# Patient Record
Sex: Female | Born: 2000 | Race: White | Hispanic: No | Marital: Single | Smoking: Never smoker
Health system: Southern US, Community
[De-identification: ages and names within clinical notes are randomized; demographics above are authoritative.]

## PROBLEM LIST (undated history)

## (undated) DIAGNOSIS — J358 Other chronic diseases of tonsils and adenoids: Secondary | ICD-10-CM

---
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
## 2021-01-18 DIAGNOSIS — L309 Dermatitis, unspecified: Secondary | ICD-10-CM | POA: Diagnosis not present

## 2021-02-06 ENCOUNTER — Other Ambulatory Visit: Payer: Self-pay

## 2021-02-06 ENCOUNTER — Ambulatory Visit (INDEPENDENT_AMBULATORY_CARE_PROVIDER_SITE_OTHER): Payer: Medicaid Other | Admitting: Nurse Practitioner

## 2021-02-06 ENCOUNTER — Encounter: Payer: Self-pay | Admitting: Nurse Practitioner

## 2021-02-06 VITALS — BP 130/90 | HR 107 | Temp 98.3°F | Resp 20 | Ht 63.0 in | Wt 113.0 lb

## 2021-02-06 DIAGNOSIS — F900 Attention-deficit hyperactivity disorder, predominantly inattentive type: Secondary | ICD-10-CM

## 2021-02-06 MED ORDER — LISDEXAMFETAMINE DIMESYLATE 40 MG PO CAPS: 40.0000 mg | ORAL_CAPSULE | ORAL | 0 refills | Status: DC

## 2021-02-06 NOTE — Progress Notes (Signed)
Subjective:    Patient ID: Jasmine Velazquez, female    DOB: 2001-11-03, 20 y.o.   MRN: [id number]   Chief Complaint: ADHD   HPI Patient brought in today by hersefl for follow up of ADHD. Currently taking vyvanse 30mg  daily. Behavior- no issues Grades- good Medication side effects- none Weight loss- none Sleeping habits- none Any concerns- says that meds wear off in about 4hours. After 4 hours she says she can no longer concentrate.   [place] CSRS reviewed: Yes Any suspicious activity on [place] Csrs: No  Contract signed: 02/06/21    Review of Systems  Constitutional: Negative for diaphoresis.  Eyes: Negative for pain.  Respiratory: Negative for shortness of breath.   Cardiovascular: Negative for chest pain, palpitations and leg swelling.  Gastrointestinal: Negative for abdominal pain.  Endocrine: Negative for polydipsia.  Skin: Negative for rash.  Neurological: Negative for dizziness, weakness and headaches.  Hematological: Does not bruise/bleed easily.  All other systems reviewed and are negative.      Objective:   Physical Exam Vitals and nursing note reviewed.  Constitutional:      General: She is not in acute distress.    Appearance: Normal appearance. She is well-developed and well-nourished.  HENT:     Mouth/Throat:     Mouth: Oropharynx is clear and moist.  Eyes:     Extraocular Movements: EOM normal.  Neck:     Vascular: No carotid bruit or JVD.  Cardiovascular:     Rate and Rhythm: Normal rate and regular rhythm.     Pulses: Intact distal pulses.     Heart sounds: Normal heart sounds.  Pulmonary:     Effort: Pulmonary effort is normal. No respiratory distress.     Breath sounds: Normal breath sounds. No wheezing or rales.  Chest:     Chest wall: No tenderness.  Abdominal:     General: There is no distension or abdominal bruit. Aorta is normal.     Palpations: There is no hepatomegaly, splenomegaly, mass or pulsatile mass.     Tenderness: There is no  abdominal tenderness.  Musculoskeletal:        General: No edema. Normal range of motion.     Cervical back: Normal range of motion and neck supple.  Lymphadenopathy:     Cervical: No cervical adenopathy.  Skin:    General: Skin is warm and dry.  Neurological:     Mental Status: She is alert and oriented to person, place, and time.     Deep Tendon Reflexes: Reflexes are normal and symmetric.  Psychiatric:        Mood and Affect: Mood and affect normal.        Behavior: Behavior normal.        Thought Content: Thought content normal.        Judgment: Judgment normal.    BP 130/90   Pulse (!) 107   Temp 98.3 F (36.8 C) (Temporal)   Resp 20   Ht 5\' 3"  (1.6 m)   Wt 113 lb (51.3 kg)   SpO2 98%   BMI 20.02 kg/m         Assessment & Plan:  02/08/21 in today with chief complaint of ADHD   1. Attention deficit hyperactivity disorder (ADHD), predominantly inattentive type Stress management 'increased dose from 30mg  to 40mg  daily - lisdexamfetamine (VYVANSE) 40 MG capsule; Take 1 capsule (40 mg total) by mouth every morning.  Dispense: 30 capsule; Refill: 0 - lisdexamfetamine (VYVANSE) 40 MG capsule;  Take 1 capsule (40 mg total) by mouth every morning.  Dispense: 30 capsule; Refill: 0 - lisdexamfetamine (VYVANSE) 40 MG capsule; Take 1 capsule (40 mg total) by mouth every morning.  Dispense: 30 capsule; Refill: 0    The above assessment and management plan was discussed with the patient. The patient verbalized understanding of and has agreed to the management plan. Patient is aware to call the clinic if symptoms persist or worsen. Patient is aware when to return to the clinic for a follow-up visit. Patient educated on when it is appropriate to go to the emergency department.   Mary-Margaret Daphine Deutscher, FNP

## 2021-03-14 DIAGNOSIS — M94262 Chondromalacia, left knee: Secondary | ICD-10-CM | POA: Diagnosis not present

## 2021-03-15 ENCOUNTER — Encounter: Payer: Self-pay | Admitting: Nurse Practitioner

## 2021-03-15 ENCOUNTER — Ambulatory Visit (INDEPENDENT_AMBULATORY_CARE_PROVIDER_SITE_OTHER): Payer: Medicaid Other | Admitting: Nurse Practitioner

## 2021-03-15 ENCOUNTER — Other Ambulatory Visit: Payer: Self-pay

## 2021-03-15 DIAGNOSIS — F900 Attention-deficit hyperactivity disorder, predominantly inattentive type: Secondary | ICD-10-CM | POA: Diagnosis not present

## 2021-03-15 MED ORDER — LISDEXAMFETAMINE DIMESYLATE 40 MG PO CAPS: 40.0000 mg | ORAL_CAPSULE | ORAL | 0 refills | Status: DC

## 2021-03-15 NOTE — Progress Notes (Signed)
   Subjective:    Patient ID: Jasmine Velazquez, female    DOB: 07-23-2001, 20 y.o.   MRN: [id number]   Chief Complaint: ADHD   HPI Patient comes in today for refill of vyvanse. She has been on ADHD meds for several years. This is her senior year in high school. Her grades are good. She is going t go to the [employer] college. Not sure what career she wants to pursue. She denies nay medication side effects, no problems sleeping and no weight loss.    Review of Systems  Constitutional: Negative for diaphoresis.  Eyes: Negative for pain.  Respiratory: Negative for shortness of breath.   Cardiovascular: Negative for chest pain, palpitations and leg swelling.  Gastrointestinal: Negative for abdominal pain.  Endocrine: Negative for polydipsia.  Skin: Negative for rash.  Neurological: Negative for dizziness, weakness and headaches.  Hematological: Does not bruise/bleed easily.  All other systems reviewed and are negative.      Objective:   Physical Exam Vitals and nursing note reviewed.  Constitutional:      Appearance: Normal appearance.  Cardiovascular:     Rate and Rhythm: Normal rate and regular rhythm.     Heart sounds: Normal heart sounds.  Pulmonary:     Effort: Pulmonary effort is normal.     Breath sounds: Normal breath sounds.  Skin:    General: Skin is warm.  Neurological:     General: No focal deficit present.     Mental Status: She is alert and oriented to person, place, and time.  Psychiatric:        Mood and Affect: Mood normal.        Behavior: Behavior normal.     BP 116/80   Pulse 91   Temp 98.9 F (37.2 C) (Temporal)   Resp 20   Ht 5\' 3"  (1.6 m)   Wt 108 lb (49 kg)   SpO2 100%   BMI 19.13 kg/m        Assessment & Plan:  in today with chief complaint of ADHD   1. Attention deficit hyperactivity disorder (ADHD), predominantly inattentive type Stress management - lisdexamfetamine (VYVANSE) 40 MG capsule; Take 1 capsule  (40 mg total) by mouth every morning.  Dispense: 30 capsule; Refill: 0 - lisdexamfetamine (VYVANSE) 40 MG capsule; Take 1 capsule (40 mg total) by mouth every morning.  Dispense: 30 capsule; Refill: 0 - lisdexamfetamine (VYVANSE) 40 MG capsule; Take 1 capsule (40 mg total) by mouth every morning.  Dispense: 30 capsule; Refill: 0    The above assessment and management plan was discussed with the patient. The patient verbalized understanding of and has agreed to the management plan. Patient is aware to call the clinic if symptoms persist or worsen. Patient is aware when to return to the clinic for a follow-up visit. Patient educated on when it is appropriate to go to the emergency department.   Mary-Margaret Jasmine Else, FNP

## 2021-03-22 ENCOUNTER — Other Ambulatory Visit: Payer: Self-pay | Admitting: Nurse Practitioner

## 2021-03-22 NOTE — Progress Notes (Signed)
Please call pharmacy and find out what is going on.. She has prescriptions in chart for vyvanse 40mg  for the following fill dates: 03/08/21  04/07/21 05/07/21 06/06/21 07/06/21 They all say confirmed receipt by pharmacy.

## 2021-07-09 ENCOUNTER — Other Ambulatory Visit: Payer: Self-pay

## 2021-07-09 DIAGNOSIS — F900 Attention-deficit hyperactivity disorder, predominantly inattentive type: Secondary | ICD-10-CM

## 2021-07-09 NOTE — Telephone Encounter (Signed)
Needs to be seen for vyvanse refill

## 2021-07-31 ENCOUNTER — Other Ambulatory Visit: Payer: Self-pay

## 2021-07-31 ENCOUNTER — Ambulatory Visit (INDEPENDENT_AMBULATORY_CARE_PROVIDER_SITE_OTHER): Payer: Medicaid Other | Admitting: Nurse Practitioner

## 2021-07-31 ENCOUNTER — Encounter: Payer: Self-pay | Admitting: Nurse Practitioner

## 2021-07-31 VITALS — BP 121/80 | HR 77 | Temp 98.0°F | Resp 20 | Ht 63.0 in | Wt 118.0 lb

## 2021-07-31 DIAGNOSIS — F900 Attention-deficit hyperactivity disorder, predominantly inattentive type: Secondary | ICD-10-CM | POA: Diagnosis not present

## 2021-07-31 MED ORDER — LISDEXAMFETAMINE DIMESYLATE 40 MG PO CAPS: 40.0000 mg | ORAL_CAPSULE | ORAL | 0 refills | Status: DC

## 2021-07-31 NOTE — Progress Notes (Signed)
Subjective:    Patient ID: Jasmine Velazquez, female    DOB: Aug 29, 2001, 20 y.o.   MRN: [id number]   Chief Complaint: ADHD   HPI Patient comes in today for refill of ADHD. She is currently on vyvanse 40mg  daily and is doing well. She has been on meds since she was in grade school. She is not able to concentrate on school work.    Review of Systems  Constitutional:  Negative for diaphoresis.  Eyes:  Negative for pain.  Respiratory:  Negative for shortness of breath.   Cardiovascular:  Negative for chest pain, palpitations and leg swelling.  Gastrointestinal:  Negative for abdominal pain.  Endocrine: Negative for polydipsia.  Skin:  Negative for rash.  Neurological:  Negative for dizziness, weakness and headaches.  Hematological:  Does not bruise/bleed easily.  All other systems reviewed and are negative.     Objective:   Physical Exam Vitals and nursing note reviewed.  Constitutional:      General: She is not in acute distress.    Appearance: Normal appearance. She is well-developed.  HENT:     Head: Normocephalic.     Right Ear: Tympanic membrane normal.     Left Ear: Tympanic membrane normal.     Nose: Nose normal.     Mouth/Throat:     Mouth: Mucous membranes are moist.  Eyes:     Pupils: Pupils are equal, round, and reactive to light.  Neck:     Vascular: No carotid bruit or JVD.  Cardiovascular:     Rate and Rhythm: Normal rate and regular rhythm.     Heart sounds: Normal heart sounds.  Pulmonary:     Effort: Pulmonary effort is normal. No respiratory distress.     Breath sounds: Normal breath sounds. No wheezing or rales.  Chest:     Chest wall: No tenderness.  Abdominal:     General: Bowel sounds are normal. There is no distension or abdominal bruit.     Palpations: Abdomen is soft. There is no hepatomegaly, splenomegaly, mass or pulsatile mass.     Tenderness: There is no abdominal tenderness.  Musculoskeletal:        General: Normal range of motion.      Cervical back: Normal range of motion and neck supple.  Lymphadenopathy:     Cervical: No cervical adenopathy.  Skin:    General: Skin is warm and dry.  Neurological:     Mental Status: She is alert and oriented to person, place, and time.     Deep Tendon Reflexes: Reflexes are normal and symmetric.  Psychiatric:        Behavior: Behavior normal.        Thought Content: Thought content normal.        Judgment: Judgment normal.    BP 121/80   Pulse 77   Temp 98 F (36.7 C) (Temporal)   Resp 20   Ht 5\' 3"  (1.6 m)   Wt 118 lb (53.5 kg)   SpO2 96%   BMI 20.90 kg/m        Assessment & Plan:  Jasmine Velazquez comes in today with chief complaint of ADHD   Diagnosis and orders addressed:  1. Attention deficit hyperactivity disorder (ADHD), predominantly inattentive type Stress management - lisdexamfetamine (VYVANSE) 40 MG capsule; Take 1 capsule (40 mg total) by mouth every morning.  Dispense: 30 capsule; Refill: 0 - lisdexamfetamine (VYVANSE) 40 MG capsule; Take 1 capsule (40 mg total) by mouth every morning.  Dispense:  30 capsule; Refill: 0 - lisdexamfetamine (VYVANSE) 40 MG capsule; Take 1 capsule (40 mg total) by mouth every morning.  Dispense: 30 capsule; Refill: 0   Follow up plan: 4-5 months   Jasmine Daphine Deutscher, FNP

## 2021-08-03 ENCOUNTER — Other Ambulatory Visit: Payer: Self-pay | Admitting: Nurse Practitioner

## 2021-08-03 DIAGNOSIS — Z3041 Encounter for surveillance of contraceptive pills: Secondary | ICD-10-CM

## 2021-08-30 ENCOUNTER — Other Ambulatory Visit: Payer: Self-pay | Admitting: Nurse Practitioner

## 2021-08-30 DIAGNOSIS — Z3041 Encounter for surveillance of contraceptive pills: Secondary | ICD-10-CM

## 2021-08-30 MED ORDER — NORGESTIMATE-ETH ESTRADIOL 0.25-35 MG-MCG PO TABS: 1.0000 | ORAL_TABLET | Freq: Every day | ORAL | 2 refills | Status: DC

## 2021-08-30 NOTE — Progress Notes (Unsigned)
Birth control refilled for 3 months. That will give you tome to make follow up appointment

## 2021-08-30 NOTE — Progress Notes (Signed)
Mychart message responded to notifying patient of refill

## 2021-10-29 ENCOUNTER — Encounter: Payer: Self-pay | Admitting: Nurse Practitioner

## 2021-10-29 ENCOUNTER — Ambulatory Visit (INDEPENDENT_AMBULATORY_CARE_PROVIDER_SITE_OTHER): Payer: Medicaid Other | Admitting: Nurse Practitioner

## 2021-10-29 ENCOUNTER — Other Ambulatory Visit: Payer: Self-pay

## 2021-10-29 DIAGNOSIS — Z3041 Encounter for surveillance of contraceptive pills: Secondary | ICD-10-CM | POA: Diagnosis not present

## 2021-10-29 DIAGNOSIS — F900 Attention-deficit hyperactivity disorder, predominantly inattentive type: Secondary | ICD-10-CM

## 2021-10-29 MED ORDER — LISDEXAMFETAMINE DIMESYLATE 40 MG PO CAPS: 40.0000 mg | ORAL_CAPSULE | ORAL | 0 refills | Status: DC

## 2021-10-29 MED ORDER — NORGESTIMATE-ETH ESTRADIOL 0.25-35 MG-MCG PO TABS: 1.0000 | ORAL_TABLET | Freq: Every day | ORAL | 2 refills | Status: DC

## 2021-10-29 NOTE — Progress Notes (Signed)
Subjective:    Patient ID: Jasmine Velazquez, female    DOB: [birth year], 20 y.o.   MRN: [id number]   Chief Complaint: Refill birth control and stomach problems (Bloating, diarrhea, constipation for 2 years or more/)    HPI: Patient has several reasons for visit to day:  - refill birth control-  no issues that she is aware of. LMP 10/25/21 and was normal. - Bloating , with constipation and diarrhea- has been intermittent for about 2 years. - Adhd- she is on vyvanse 40mg  and is doing well. She is currently going to school at [employer]- meds help her to concentrate on her school work. No medication side effects.    Outpatient Encounter Medications as of 10/29/2021  Medication Sig   lisdexamfetamine (VYVANSE) 40 MG capsule Take 1 capsule (40 mg total) by mouth every morning.   norgestimate-ethinyl estradiol (ESTARYLLA) 0.25-35 MG-MCG tablet Take 1 tablet by mouth daily. (NEEDS TO BE SEEN BEFORE NEXT REFILL)   lisdexamfetamine (VYVANSE) 40 MG capsule Take 1 capsule (40 mg total) by mouth every morning.   lisdexamfetamine (VYVANSE) 40 MG capsule Take 1 capsule (40 mg total) by mouth every morning.   No facility-administered encounter medications on file as of 10/29/2021.    Past Surgical History:  Procedure Laterality Date   TONSILLECTOMY AND ADENOIDECTOMY Bilateral 06/11/2019   Procedure: TONSILLECTOMY AND ADENOIDECTOMY;  Surgeon: 06/13/2019, MD;  Location: [facility];  Service: ENT;  Laterality: Bilateral;    Family History  Problem Relation Age of Onset   Hypertension Mother    Liver disease Mother    Other Sister        Cyclic Vomiting Syndrome    New complaints: None today  Social history: Lives with her dad  Controlled substance contract: n/a     Review of Systems  Constitutional:  Negative for diaphoresis.  Eyes:  Negative for pain.  Respiratory:  Negative for shortness of breath.   Cardiovascular:  Negative for chest pain, palpitations and leg swelling.   Gastrointestinal:  Positive for constipation and diarrhea. Negative for nausea and vomiting.  Endocrine: Negative for polydipsia.  Skin:  Negative for rash.  Neurological:  Negative for dizziness, weakness and headaches.  Hematological:  Does not bruise/bleed easily.  All other systems reviewed and are negative.     Objective:   Physical Exam Vitals and nursing note reviewed.  Constitutional:      General: She is not in acute distress.    Appearance: Normal appearance. She is well-developed.  Neck:     Vascular: No carotid bruit or JVD.  Cardiovascular:     Rate and Rhythm: Normal rate and regular rhythm.     Heart sounds: Normal heart sounds.  Pulmonary:     Effort: Pulmonary effort is normal. No respiratory distress.     Breath sounds: Normal breath sounds. No wheezing or rales.  Chest:     Chest wall: No tenderness.  Abdominal:     General: Bowel sounds are normal. There is no distension or abdominal bruit.     Palpations: Abdomen is soft. There is no hepatomegaly, splenomegaly, mass or pulsatile mass.     Tenderness: There is no abdominal tenderness.  Musculoskeletal:        General: Normal range of motion.     Cervical back: Normal range of motion and neck supple.  Lymphadenopathy:     Cervical: No cervical adenopathy.  Skin:    General: Skin is warm and dry.  Neurological:  Mental Status: She is alert and oriented to person, place, and time.     Deep Tendon Reflexes: Reflexes are normal and symmetric.  Psychiatric:        Behavior: Behavior normal.        Thought Content: Thought content normal.        Judgment: Judgment normal.    BP 122/84   Pulse (!) 101   Temp 98 F (36.7 C) (Temporal)   Resp 20   Ht 5\' 3"  (1.6 m)   Wt 113 lb (51.3 kg)   SpO2 100%   BMI 20.02 kg/m        Assessment & Plan:   in today with chief complaint of Refill birth control and stomach problems (Bloating, diarrhea, constipation for 2 years or more/)   1.  Encounter for surveillance of contraceptive pills - norgestimate-ethinyl estradiol (ESTARYLLA) 0.25-35 MG-MCG tablet; Take 1 tablet by mouth daily. (NEEDS TO BE SEEN BEFORE NEXT REFILL)  Dispense: 28 tablet; Refill: 2  2. Attention deficit hyperactivity disorder (ADHD), predominantly inattentive type Stress management - lisdexamfetamine (VYVANSE) 40 MG capsule; Take 1 capsule (40 mg total) by mouth every morning.  Dispense: 30 capsule; Refill: 0 - lisdexamfetamine (VYVANSE) 40 MG capsule; Take 1 capsule (40 mg total) by mouth every morning.  Dispense: 30 capsule; Refill: 0 - lisdexamfetamine (VYVANSE) 40 MG capsule; Take 1 capsule (40 mg total) by mouth every morning.  Dispense: 30 capsule; Refill: 0  3. constipation Miralax daily with apple juice No xray available today. If not improving will have her come back to office for KUB   The above assessment and management plan was discussed with the patient. The patient verbalized understanding of and has agreed to the management plan. Patient is aware to call the clinic if symptoms persist or worsen. Patient is aware when to return to the clinic for a follow-up visit. Patient educated on when it is appropriate to go to the emergency department.   Mary-Margaret 09-01-1999, FNP

## 2021-10-29 NOTE — Patient Instructions (Signed)

## 2022-02-04 ENCOUNTER — Encounter: Payer: Self-pay | Admitting: Nurse Practitioner

## 2022-02-04 ENCOUNTER — Telehealth (INDEPENDENT_AMBULATORY_CARE_PROVIDER_SITE_OTHER): Payer: Medicaid Other | Admitting: Nurse Practitioner

## 2022-02-04 DIAGNOSIS — F900 Attention-deficit hyperactivity disorder, predominantly inattentive type: Secondary | ICD-10-CM

## 2022-02-04 MED ORDER — LISDEXAMFETAMINE DIMESYLATE 40 MG PO CAPS: 40.0000 mg | ORAL_CAPSULE | ORAL | 0 refills | Status: DC

## 2022-02-04 NOTE — Patient Instructions (Signed)

## 2022-02-04 NOTE — Progress Notes (Signed)
Virtual Visit Consent   Jasmine Velazquez, you are scheduled for a virtual visit with [name], [place], a [facility] provider, today.     Just as with appointments in the office, your consent must be obtained to participate.  Your consent will be active for this visit and any virtual visit you may have with one of our providers in the next 365 days.     If you have a MyChart account, a copy of this consent can be sent to you electronically.  All virtual visits are billed to your insurance company just like a traditional visit in the office.    As this is a virtual visit, video technology does not allow for your provider to perform a traditional examination.  This may limit your provider's ability to fully assess your condition.  If your provider identifies any concerns that need to be evaluated in person or the need to arrange testing (such as labs, EKG, etc.), we will make arrangements to do so.     Although advances in technology are sophisticated, we cannot ensure that it will always work on either your end or our end.  If the connection with a video visit is poor, the visit may have to be switched to a telephone visit.  With either a video or telephone visit, we are not always able to ensure that we have a secure connection.     I need to obtain your verbal consent now.   Are you willing to proceed with your visit today? YES   [name] , sleeps about 7-8 hours a nighthas provided verbal consent on 02/04/2022 for a virtual visit (video or telephone).   [name], FNP   Date: 02/04/2022 4:42 PM   Virtual Visit via Video Note   I, [name], connected with Jasmine Velazquez ([id number], 2001/09/19) on 02/04/22 at  5:30 PM EST by a video-enabled telemedicine application and verified that I am speaking with the correct person using two identifiers.  Location: Patient: Virtual Visit Location Patient: Home Provider: Virtual Visit Location Provider: Mobile    I discussed the limitations of evaluation and management by telemedicine and the availability of in person appointments. The patient expressed understanding and agreed to proceed.    History of Present Illness: Jasmine Velazquez is a 21 y.o. who identifies as a female who was assigned female at birth, and is being seen today for ADHD .  HPI: Video visit for follow up of ADHD. Currently taking vyvanse . Behavior- good  Grades- good- [employer] Medication side effects- none Weight loss- none Sleeping habits- sleeps aboiyt 7-8 hours a night Any concerns- none   [place] CSRS reviewed: Yes Any suspicious activity on [place] Csrs: No  Contract signed: patient not in person. Will have signed at next visit     Review of Systems  Constitutional:  Negative for diaphoresis and weight loss.  Eyes:  Negative for blurred vision, double vision and pain.  Respiratory:  Negative for shortness of breath.   Cardiovascular:  Negative for chest pain, palpitations, orthopnea and leg swelling.  Gastrointestinal:  Negative for abdominal pain.  Skin:  Negative for rash.  Neurological:  Negative for dizziness, sensory change, loss of consciousness, weakness and headaches.  Endo/Heme/Allergies:  Negative for polydipsia. Does not bruise/bleed easily.  Psychiatric/Behavioral:  Negative for memory loss. The patient does not have insomnia.   All other systems reviewed and are negative.  Problems:  Patient Active Problem List   Diagnosis Date Noted   Attention  deficit hyperactivity disorder (ADHD), predominantly inattentive type 03/15/2021   Mouth sore 09/08/2020   Dysmenorrhea in adolescent 05/18/2019   Acne vulgaris 05/18/2019   Left shoulder pain 09/08/2015    Allergies: No Known Allergies Medications:  Current Outpatient Medications:    lisdexamfetamine (VYVANSE) 40 MG capsule, Take 1 capsule (40 mg total) by mouth every morning., Disp: 30 capsule, Rfl: 0   lisdexamfetamine (VYVANSE) 40 MG capsule, Take 1 capsule  (40 mg total) by mouth every morning., Disp: 30 capsule, Rfl: 0   lisdexamfetamine (VYVANSE) 40 MG capsule, Take 1 capsule (40 mg total) by mouth every morning., Disp: 30 capsule, Rfl: 0   norgestimate-ethinyl estradiol (ESTARYLLA) 0.25-35 MG-MCG tablet, Take 1 tablet by mouth daily. (NEEDS TO BE SEEN BEFORE NEXT REFILL), Disp: 28 tablet, Rfl: 2  Observations/Objective: Patient is well-developed, well-nourished in no acute distress.  Resting comfortably  at home.  Head is normocephalic, atraumatic.  No labored breathing.  Speech is clear and coherent with logical content.  Patient is alert and oriented at baseline.    Assessment and Plan:  [name] in today with chief complaint of No chief complaint on file.   1. Attention deficit hyperactivity disorder (ADHD), predominantly inattentive type Stress management - lisdexamfetamine (VYVANSE) 40 MG capsule; Take 1 capsule (40 mg total) by mouth every morning.  Dispense: 30 capsule; Refill: 0 - lisdexamfetamine (VYVANSE) 40 MG capsule; Take 1 capsule (40 mg total) by mouth every morning.  Dispense: 30 capsule; Refill: 0 - lisdexamfetamine (VYVANSE) 40 MG capsule; Take 1 capsule (40 mg total) by mouth every morning.  Dispense: 30 capsule; Refill: 0     Follow Up Instructions: I discussed the assessment and treatment plan with the patient. The patient was provided an opportunity to ask questions and all were answered. The patient agreed with the plan and demonstrated an understanding of the instructions.  A copy of instructions were sent to the patient via MyChart.  The patient was advised to call back or seek an in-person evaluation if the symptoms worsen or if the condition fails to improve as anticipated.  Time:  I spent 8 minutes with the patient via telehealth technology discussing the above problems/concerns.    [name], FNP

## 2022-02-11 ENCOUNTER — Telehealth: Payer: Self-pay | Admitting: Nurse Practitioner

## 2022-02-11 ENCOUNTER — Other Ambulatory Visit: Payer: Self-pay | Admitting: Family Medicine

## 2022-02-11 DIAGNOSIS — F900 Attention-deficit hyperactivity disorder, predominantly inattentive type: Secondary | ICD-10-CM

## 2022-02-11 MED ORDER — LISDEXAMFETAMINE DIMESYLATE 40 MG PO CAPS: 40.0000 mg | ORAL_CAPSULE | ORAL | 0 refills | Status: DC

## 2022-02-11 NOTE — Telephone Encounter (Signed)
Please send to [facility]

## 2022-02-11 NOTE — Telephone Encounter (Signed)
Patient is going to call back to let us know what pharmacy to send too.

## 2022-02-11 NOTE — Telephone Encounter (Signed)
Please let the patient know that I sent their prescription to their pharmacy. Thanks, WS 

## 2022-02-12 NOTE — Telephone Encounter (Signed)
Patient aware and verbalized understanding. °

## 2022-02-18 ENCOUNTER — Other Ambulatory Visit: Payer: Self-pay | Admitting: Nurse Practitioner

## 2022-02-18 DIAGNOSIS — Z3041 Encounter for surveillance of contraceptive pills: Secondary | ICD-10-CM

## 2022-03-12 ENCOUNTER — Other Ambulatory Visit: Payer: Self-pay | Admitting: Family Medicine

## 2022-03-12 DIAGNOSIS — F900 Attention-deficit hyperactivity disorder, predominantly inattentive type: Secondary | ICD-10-CM

## 2022-03-13 NOTE — Telephone Encounter (Signed)
Pt needs March and April refills to go to lisdexamfetamine (VYVANSE) 40 MG capsule to go to [facility]. Cancel at [facility]. ?

## 2022-03-14 ENCOUNTER — Telehealth: Payer: Self-pay | Admitting: Nurse Practitioner

## 2022-03-14 ENCOUNTER — Other Ambulatory Visit: Payer: Self-pay | Admitting: Nurse Practitioner

## 2022-03-14 DIAGNOSIS — F900 Attention-deficit hyperactivity disorder, predominantly inattentive type: Secondary | ICD-10-CM

## 2022-03-14 MED ORDER — LISDEXAMFETAMINE DIMESYLATE 40 MG PO CAPS: 40.0000 mg | ORAL_CAPSULE | Freq: Every morning | ORAL | 0 refills | Status: DC

## 2022-03-14 NOTE — Progress Notes (Signed)
Vyvanse changed and sen tot [facility] ?

## 2022-03-14 NOTE — Telephone Encounter (Signed)
Pt aware refill sent to [facility]  

## 2022-03-14 NOTE — Telephone Encounter (Signed)
?  Prescription Request ? ?03/14/2022 ? ?Is this a "Controlled Substance" medicine? yes ? ?Have you seen your PCP in the last 2 weeks? No, she should have refills but wants it sent to [facility] and [facility] [place] ? ?If YES, route message to pool  -  If NO, patient needs to be scheduled for appointment. ? ?What is the name of the medication or equipment? lisdexamfetamine (VYVANSE) 40 MG capsule ? ?Have you contacted your pharmacy to request a refill? Yes, she said they sent in a request   ? ?Which pharmacy would you like this sent to? [facility]  ? ? ?Patient notified that their request is being sent to the clinical staff for review and that they should receive a response within 2 business days.  ?  ?

## 2022-04-23 ENCOUNTER — Ambulatory Visit: Payer: Medicaid Other | Admitting: Nurse Practitioner

## 2022-05-01 ENCOUNTER — Encounter: Payer: Self-pay | Admitting: Nurse Practitioner

## 2022-05-01 ENCOUNTER — Ambulatory Visit (INDEPENDENT_AMBULATORY_CARE_PROVIDER_SITE_OTHER): Payer: Medicaid Other | Admitting: Nurse Practitioner

## 2022-05-01 VITALS — BP 113/79 | HR 79 | Temp 98.6°F | Resp 20 | Ht 63.0 in | Wt 115.0 lb

## 2022-05-01 DIAGNOSIS — E162 Hypoglycemia, unspecified: Secondary | ICD-10-CM | POA: Diagnosis not present

## 2022-05-01 DIAGNOSIS — F900 Attention-deficit hyperactivity disorder, predominantly inattentive type: Secondary | ICD-10-CM

## 2022-05-01 MED ORDER — LISDEXAMFETAMINE DIMESYLATE 40 MG PO CAPS: 40.0000 mg | ORAL_CAPSULE | ORAL | 0 refills | Status: DC

## 2022-05-01 MED ORDER — LISDEXAMFETAMINE DIMESYLATE 40 MG PO CAPS: 40.0000 mg | ORAL_CAPSULE | Freq: Every morning | ORAL | 0 refills | Status: DC

## 2022-05-01 NOTE — Patient Instructions (Signed)

## 2022-05-01 NOTE — Progress Notes (Signed)
? ?Subjective:  ? ? Patient ID: Jasmine Velazquez, female    DOB: Jul 21, 2001, 21 y.o.   MRN: [id number] ? ? ?Chief Complaint: medical management of chronic issues  ?  ? ?HPI: ? ?Jasmine Velazquez is a 21 y.o. who identifies as a female who was assigned female at birth.  ? ?Social history: ?Lives with: dad ?Work history: Electronics engineer ? ? ?Comes in today for follow up of the following chronic medical issues: ? ?1. Attention deficit hyperactivity disorder (ADHD), predominantly inattentive type ?Patient is currently on vyvanse 70m daily. She has been on thnis ofr several years. She denies any medication side effects. She is currently a [facility] [employer] She is doing well on meds ? ?/Lorenza Burtonsigned- 05/01/22. Urine drug screen today ? ? ?New complaints: ?None today ? ?No Known Allergies ?Outpatient Encounter Medications as of 05/01/2022  ?Medication Sig  ? lisdexamfetamine (VYVANSE) 40 MG capsule Take 1 capsule (40 mg total) by mouth every morning.  ? lisdexamfetamine (VYVANSE) 40 MG capsule Take 1 capsule (40 mg total) by mouth every morning.  ? lisdexamfetamine (VYVANSE) 40 MG capsule Take 1 capsule (40 mg total) by mouth every morning.  ? norgestimate-ethinyl estradiol (ORTHO-CYCLEN) 0.25-35 MG-MCG tablet TAKE 1 TABLET BY MOUTH DAILY. (NEEDS TO BE SEEN BEFORE NEXT REFILL)  ? ?No facility-administered encounter medications on file as of 05/01/2022.  ? ? ?Past Surgical History:  ?Procedure Laterality Date  ? TONSILLECTOMY AND ADENOIDECTOMY Bilateral 06/11/2019  ? Procedure: TONSILLECTOMY AND ADENOIDECTOMY;  Surgeon: TLeta Baptist MD;  [facility]  Service: ENT;  Laterality: Bilateral;  ? ? ?Family History  ?Problem Relation Age of Onset  ? Hypertension Mother   ? Liver disease Mother   ? Other Sister   ?     Cyclic Vomiting Syndrome  ? ? ?Patient says that she has had drops I her blood sugar. Occurs a couple hours after eating. She would like to have blood work done. ? ? ? ?Review of Systems   ?Constitutional:  Negative for diaphoresis.  ?Eyes:  Negative for pain.  ?Respiratory:  Negative for shortness of breath.   ?Cardiovascular:  Negative for chest pain, palpitations and leg swelling.  ?Gastrointestinal:  Negative for abdominal pain.  ?Endocrine: Negative for polydipsia.  ?Skin:  Negative for rash.  ?Neurological:  Negative for dizziness, weakness and headaches.  ?Hematological:  Does not bruise/bleed easily.  ?All other systems reviewed and are negative. ? ?   ?Objective:  ? Physical Exam ?Vitals and nursing note reviewed.  ?Constitutional:   ?   General: She is not in acute distress. ?   Appearance: Normal appearance. She is well-developed.  ?HENT:  ?   Head: Normocephalic.  ?   Right Ear: Tympanic membrane normal.  ?   Left Ear: Tympanic membrane normal.  ?   Nose: Nose normal.  ?   Mouth/Throat:  ?   Mouth: Mucous membranes are moist.  ?Eyes:  ?   Pupils: Pupils are equal, round, and reactive to light.  ?Neck:  ?   Vascular: No carotid bruit or JVD.  ?Cardiovascular:  ?   Rate and Rhythm: Normal rate and regular rhythm.  ?   Heart sounds: Normal heart sounds.  ?Pulmonary:  ?   Effort: Pulmonary effort is normal. No respiratory distress.  ?   Breath sounds: Normal breath sounds. No wheezing or rales.  ?Chest:  ?   Chest wall: No tenderness.  ?Abdominal:  ?   General: Bowel sounds are normal. There  is no distension or abdominal bruit.  ?   Palpations: Abdomen is soft. There is no hepatomegaly, splenomegaly, mass or pulsatile mass.  ?   Tenderness: There is no abdominal tenderness.  ?Musculoskeletal:     ?   General: Normal range of motion.  ?   Cervical back: Normal range of motion and neck supple.  ?Lymphadenopathy:  ?   Cervical: No cervical adenopathy.  ?Skin: ?   General: Skin is warm and dry.  ?Neurological:  ?   Mental Status: She is alert and oriented to person, place, and time.  ?   Deep Tendon Reflexes: Reflexes are normal and symmetric.  ?Psychiatric:     ?   Behavior: Behavior normal.      ?   Thought Content: Thought content normal.     ?   Judgment: Judgment normal.  ? ?BP 113/79   Pulse 79   Temp 98.6 ?F (37 ?C) (Temporal)   Resp 20   Ht '5\' 3"'  (1.6 m)   Wt 115 lb (52.2 kg)   SpO2 100%   BMI 20.37 kg/m?  ? ? ? ? ?   ?Assessment & Plan:  ? ?Jasmine Velazquez comes in today with chief complaint of ADHD (Wants blood work/) ? ? ?Diagnosis and orders addressed: ? ?1. Attention deficit hyperactivity disorder (ADHD), predominantly inattentive type ?Stress management ?- lisdexamfetamine (VYVANSE) 40 MG capsule; Take 1 capsule (40 mg total) by mouth every morning.  Dispense: 30 capsule; Refill: 0 ?- lisdexamfetamine (VYVANSE) 40 MG capsule; Take 1 capsule (40 mg total) by mouth every morning.  Dispense: 30 capsule; Refill: 0 ?- lisdexamfetamine (VYVANSE) 40 MG capsule; Take 1 capsule (40 mg total) by mouth every morning.  Dispense: 30 capsule; Refill: 0 ? ?2. Hypoglycemia ?Do not skip meals ?- CBC with Differential/Platelet ?- CMP14+EGFR ?- Lipid panel ?- Thyroid Panel With TSH ? ? ?Labs pending ?Health Maintenance reviewed ?Diet and exercise encouraged ? ?Follow up plan: ?3 months ? ? ?[name], FNP ? ?

## 2022-05-02 LAB — CBC WITH DIFFERENTIAL/PLATELET
Basophils Absolute: 0.1 10*3/uL (ref 0.0–0.2)
Basos: 1 %
EOS (ABSOLUTE): 0.1 10*3/uL (ref 0.0–0.4)
Eos: 2 %
Hematocrit: 38.5 % (ref 34.0–46.6)
Hemoglobin: 13.5 g/dL (ref 11.1–15.9)
Immature Grans (Abs): 0 10*3/uL (ref 0.0–0.1)
Immature Granulocytes: 0 %
Lymphocytes Absolute: 2.7 10*3/uL (ref 0.7–3.1)
Lymphs: 50 %
MCH: 31.5 pg (ref 26.6–33.0)
MCHC: 35.1 g/dL (ref 31.5–35.7)
MCV: 90 fL (ref 79–97)
Monocytes Absolute: 0.4 10*3/uL (ref 0.1–0.9)
Monocytes: 8 %
Neutrophils Absolute: 2.1 10*3/uL (ref 1.4–7.0)
Neutrophils: 39 %
Platelets: 303 10*3/uL (ref 150–450)
RBC: 4.28 x10E6/uL (ref 3.77–5.28)
RDW: 11.9 % (ref 11.7–15.4)
WBC: 5.4 10*3/uL (ref 3.4–10.8)

## 2022-05-02 LAB — LIPID PANEL
Chol/HDL Ratio: 2.6 ratio (ref 0.0–4.4)
Cholesterol, Total: 198 mg/dL — ABNORMAL HIGH (ref 100–169)
HDL: 76 mg/dL (ref >39)
LDL Chol Calc (NIH): 112 mg/dL — ABNORMAL HIGH (ref 0–109)
Triglycerides: 56 mg/dL (ref 0–89)
VLDL Cholesterol Cal: 10 mg/dL (ref 5–40)

## 2022-05-02 LAB — THYROID PANEL WITH TSH
Free Thyroxine Index: 2.3 (ref 1.2–4.9)
T3 Uptake Ratio: 22 % — ABNORMAL LOW (ref 24–39)
T4, Total: 10.3 ug/dL (ref 4.5–12.0)
TSH: 1.31 u[IU]/mL (ref 0.450–4.500)

## 2022-05-02 LAB — CMP14+EGFR
ALT: 11 IU/L (ref 0–32)
AST: 18 IU/L (ref 0–40)
Albumin/Globulin Ratio: 1.5 (ref 1.2–2.2)
Albumin: 4.2 g/dL (ref 3.9–5.0)
Alkaline Phosphatase: 65 IU/L (ref 42–106)
BUN/Creatinine Ratio: 12 (ref 9–23)
BUN: 8 mg/dL (ref 6–20)
Bilirubin Total: 0.2 mg/dL (ref 0.0–1.2)
CO2: 23 mmol/L (ref 20–29)
Calcium: 9.2 mg/dL (ref 8.7–10.2)
Chloride: 102 mmol/L (ref 96–106)
Creatinine, Ser: 0.68 mg/dL (ref 0.57–1.00)
Globulin, Total: 2.8 g/dL (ref 1.5–4.5)
Glucose: 82 mg/dL (ref 70–99)
Potassium: 4.5 mmol/L (ref 3.5–5.2)
Sodium: 137 mmol/L (ref 134–144)
Total Protein: 7 g/dL (ref 6.0–8.5)
eGFR: 129 mL/min/{1.73_m2} (ref >59)

## 2022-05-12 DIAGNOSIS — J00 Acute nasopharyngitis [common cold]: Secondary | ICD-10-CM | POA: Diagnosis not present

## 2022-08-13 ENCOUNTER — Ambulatory Visit (INDEPENDENT_AMBULATORY_CARE_PROVIDER_SITE_OTHER): Payer: Medicaid Other | Admitting: Nurse Practitioner

## 2022-08-13 ENCOUNTER — Encounter: Payer: Self-pay | Admitting: Nurse Practitioner

## 2022-08-13 VITALS — BP 119/81 | HR 88 | Temp 97.9°F | Resp 20 | Ht 63.0 in | Wt 114.0 lb

## 2022-08-13 DIAGNOSIS — Z3041 Encounter for surveillance of contraceptive pills: Secondary | ICD-10-CM

## 2022-08-13 DIAGNOSIS — F900 Attention-deficit hyperactivity disorder, predominantly inattentive type: Secondary | ICD-10-CM | POA: Diagnosis not present

## 2022-08-13 MED ORDER — METHYLPHENIDATE HCL ER (OSM) 36 MG PO TBCR: 36.0000 mg | EXTENDED_RELEASE_TABLET | Freq: Every day | ORAL | 0 refills | Status: DC

## 2022-08-13 MED ORDER — NORGESTIMATE-ETH ESTRADIOL 0.25-35 MG-MCG PO TABS: 1.0000 | ORAL_TABLET | Freq: Every day | ORAL | 7 refills | Status: DC

## 2022-08-13 NOTE — Progress Notes (Signed)
Subjective:    Patient ID: Jasmine Velazquez, female    DOB: 2001-04-10, 21 y.o.   MRN: [id number]  Jasmine Velazquez in today with chief complaint of ADHD   1. Attention deficit hyperactivity disorder (ADHD), predominantly inattentive type Patient is on vyvanse 40mg  daily and is doing well. She has been in adhd meds for several years. Is not able to concentrate in school without meds. She has been thinking about changing meds,. She says she thinks it makes her moody and she says it does not last all day.      Review of Systems  Constitutional:  Negative for diaphoresis.  Eyes:  Negative for pain.  Respiratory:  Negative for shortness of breath.   Cardiovascular:  Negative for chest pain, palpitations and leg swelling.  Gastrointestinal:  Negative for abdominal pain.  Endocrine: Negative for polydipsia.  Skin:  Negative for rash.  Neurological:  Negative for dizziness, weakness and headaches.  Hematological:  Does not bruise/bleed easily.  All other systems reviewed and are negative.      Objective:   Physical Exam Vitals and nursing note reviewed.  Constitutional:      General: She is not in acute distress.    Appearance: Normal appearance. She is well-developed.  Neck:     Vascular: No carotid bruit or JVD.  Cardiovascular:     Rate and Rhythm: Normal rate and regular rhythm.     Heart sounds: Normal heart sounds.  Pulmonary:     Effort: Pulmonary effort is normal. No respiratory distress.     Breath sounds: Normal breath sounds. No wheezing or rales.  Chest:     Chest wall: No tenderness.  Abdominal:     General: Bowel sounds are normal. There is no distension or abdominal bruit.     Palpations: Abdomen is soft. There is no hepatomegaly, splenomegaly, mass or pulsatile mass.     Tenderness: There is no abdominal tenderness.  Musculoskeletal:        General: Normal range of motion.     Cervical back: Normal range of motion and neck supple.  Lymphadenopathy:      Cervical: No cervical adenopathy.  Skin:    General: Skin is warm and dry.  Neurological:     Mental Status: She is alert and oriented to person, place, and time.     Deep Tendon Reflexes: Reflexes are normal and symmetric.  Psychiatric:        Behavior: Behavior normal.        Thought Content: Thought content normal.        Judgment: Judgment normal.    BP 119/81   Pulse 88   Temp 97.9 F (36.6 C) (Temporal)   Resp 20   Ht 5\' 3"  (1.6 m)   Wt 114 lb (51.7 kg)   SpO2 100%   BMI 20.19 kg/m         Assessment & Plan:  in today with chief complaint of ADHD   1. Attention deficit hyperactivity disorder (ADHD), predominantly inattentive type Stress management Will try concerta and see how that works for her. - methylphenidate (CONCERTA) 36 MG PO CR tablet; Take 1 tablet (36 mg total) by mouth daily.  Dispense: 30 tablet; Refill: 0    The above assessment and management plan was discussed with the patient. The patient verbalized understanding of and has agreed to the management plan. Patient is aware to call the clinic if symptoms persist or worsen. Patient is aware when to return to  the clinic for a follow-up visit. Patient educated on when it is appropriate to go to the emergency department.   Mary-Margaret Daphine Deutscher, FNP

## 2022-08-13 NOTE — Patient Instructions (Signed)
Methylphenidate Extended-Release Tablets What is this medication? METHYLPHENIDATE (meth il FEN i date) treats attention-deficit hyperactivity disorder (ADHD). It works by improving focus and reducing impulsive behavior. It may also be used to treat narcolepsy. It works by promoting wakefulness. It belongs to a group of medications called stimulants. This medicine may be used for other purposes; ask your health care provider or pharmacist if you have questions. COMMON BRAND NAME(S): Concerta, Metadate ER, Methylin, RELEXXII, Ritalin SR What should I tell my care team before I take this medication? They need to know if you have any of these conditions: Anxiety or panic attacks Circulation problems in fingers and toes Difficulty swallowing, problems with the esophagus, or a history of blockage of the stomach or intestines Glaucoma Hardening or blockages of the arteries or heart blood vessels Heart disease or a heart defect High blood pressure History of a drug or alcohol abuse problem History of stroke Liver disease Mental illness Motor tics, family history or diagnosis of Tourette's syndrome Seizures Suicidal thoughts, plans, or attempt; a previous suicide attempt by you or a family member Thyroid disease An unusual or allergic reaction to methylphenidate, other medications, foods, dyes, or preservatives Pregnant or trying to get pregnant Breast-feeding How should I use this medication? Take this medication by mouth with a glass of water. Follow the directions on the prescription label. Do not crush, cut, or chew the tablet. You may take this medication with food. Take your medication at regular intervals. Do not take it more often than directed. If you take your medication more than once a day, try to take your last dose at least 8 hours before bedtime. This well help prevent the medication from interfering with your sleep. A special MedGuide will be given to you by the pharmacist with each  prescription and refill. Be sure to read this information carefully each time. Talk to your care team regarding the use of this medication in children. While this medication may be prescribed for children as young as 6 years for selected conditions, precautions do apply. Overdosage: If you think you have taken too much of this medicine contact a poison control center or emergency room at once. NOTE: This medicine is only for you. Do not share this medicine with others. What if I miss a dose? If you miss a dose, take it as soon as you can. If it is almost time for your next dose, take only that dose. Do not take double or extra doses. What may interact with this medication? Do not take this medication with any of the following: Lithium MAOIs like Carbex, Eldepryl, Marplan, Nardil, and Parnate Other stimulant medications for attention disorders, weight loss, or to stay awake Procarbazine This medication may also interact with the following: Atomoxetine Caffeine Certain medications for blood pressure, heart disease, irregular heart beat Certain medications for depression, anxiety, or psychotic disturbances Certain medications for seizures like carbamazepine, phenobarbital, phenytoin Cold or allergy medications Warfarin This list may not describe all possible interactions. Give your health care provider a list of all the medicines, herbs, non-prescription drugs, or dietary supplements you use. Also tell them if you smoke, drink alcohol, or use illegal drugs. Some items may interact with your medicine. What should I watch for while using this medication? Visit your care team for regular checks on your progress. This prescription requires that you follow special procedures with your care team and pharmacy. You will need to have a new written prescription from your care team every time you  need a refill. This medication may affect your concentration, or hide signs of tiredness. Until you know how  this medication affects you, do not drive, ride a bicycle, use machinery, or do anything that needs mental alertness. Tell your care team if this medication loses its effects, or if you feel you need to take more than the prescribed amount. Do not change the dosage without talking to your care team. For males, contact your care team right away if you have an erection that lasts longer than 4 hours or if it becomes painful. This may be a sign of a serious problem and must be treated right away to prevent permanent damage. Decreased appetite is a common side effect when starting this medication. Eating small, frequent meals or snacks can help. Talk to your care team if you continue to have poor eating habits. Height and weight growth of a child taking this medication will be monitored closely. Do not take this medication close to bedtime. It may prevent you from sleeping. The tablet shell for some brands of this medication does not dissolve. This is normal. The tablet shell may appear whole in the stool. This is not a cause for concern. If you are going to need surgery, a MRI, CT scan, or other procedure, tell your care team that you are taking this medication. You may need to stop taking this medication before the procedure. Tell your care team right away if you notice unexplained wounds on your fingers and toes while taking this medication. You should also tell your care team if you experience numbness or pain, changes in the skin color, or sensitivity to temperature in your fingers or toes. What side effects may I notice from receiving this medication? Side effects that you should report to your care team as soon as possible: Allergic reactions--skin rash, itching, hives, swelling of the face, lips, tongue, or throat Heart rhythm changes--fast or irregular heartbeat, dizziness, feeling faint or lightheaded, chest pain, trouble breathing Increase in blood pressure Mood and behavior changes--anxiety,  nervousness, confusion, hallucinations, irritability, hostility, thoughts of suicide or self-harm, worsening mood, feelings of depression Prolonged or painful erection Raynaud's--cool, numb, or painful fingers or toes that may change color from pale, to blue, to red Stroke in adults--sudden numbness or weakness of the face, arm, or leg, trouble speaking, confusion, trouble walking, loss of balance or coordination, dizziness, severe headache, change in vision Side effects that usually do not require medical attention (report to your care team if they continue or are bothersome): Anxiety, nervousness Blurry vision Headache Loss of appetite Nausea Trouble sleeping Weight loss This list may not describe all possible side effects. Call your doctor for medical advice about side effects. You may report side effects to FDA at 1-800-FDA-1088. Where should I keep my medication? Keep out of the reach of children and pets. This medication can be abused. Keep your medication in a safe place to protect it from theft. Do not share this medication with anyone. Selling or giving away this medication is dangerous and against the law. Store at room temperature between 15 and 30 degrees C (59 and 86 degrees F). Protect from light and moisture. Keep container tightly closed. This medication may cause accidental overdose and death if taken by other adults, children, or pets. Mix any unused medication with a substance like cat litter or coffee grounds. Then throw the medication away in a sealed container like a sealed bag or a coffee can with a lid. Do not use  the medication after the expiration date. NOTE: This sheet is a summary. It may not cover all possible information. If you have questions about this medicine, talk to your doctor, pharmacist, or health care provider.  2023 Elsevier/Gold Standard (2021-01-18 00:00:00)

## 2022-08-26 MED ORDER — AMPHETAMINE-DEXTROAMPHETAMINE 20 MG PO TABS: 20.0000 mg | ORAL_TABLET | Freq: Two times a day (BID) | ORAL | 0 refills | Status: DC

## 2022-08-26 NOTE — Telephone Encounter (Signed)
Patient vyvanse has been wearing off  to early. We tried her on concerta at last visit and she does  not like how that makes her feel. We will try adderall 20mg  BID and see how she does. Called patient to discuss. Allowed time for questions.   Meds ordered this encounter  Medications   amphetamine-dextroamphetamine (ADDERALL) 20 MG tablet    Sig: Take 1 tablet (20 mg total) by mouth 2 (two) times daily.    Dispense:  60 tablet    Refill:  0    Order Specific Question:   Supervising Provider    Answer:   Nils Pyle   Mary-Margaret [[id number]], FNP

## 2022-08-28 DIAGNOSIS — M9903 Segmental and somatic dysfunction of lumbar region: Secondary | ICD-10-CM | POA: Diagnosis not present

## 2022-08-28 DIAGNOSIS — M9901 Segmental and somatic dysfunction of cervical region: Secondary | ICD-10-CM | POA: Diagnosis not present

## 2022-08-28 DIAGNOSIS — M62830 Muscle spasm of back: Secondary | ICD-10-CM | POA: Diagnosis not present

## 2022-08-28 DIAGNOSIS — M9902 Segmental and somatic dysfunction of thoracic region: Secondary | ICD-10-CM | POA: Diagnosis not present

## 2022-08-29 DIAGNOSIS — M9903 Segmental and somatic dysfunction of lumbar region: Secondary | ICD-10-CM | POA: Diagnosis not present

## 2022-08-29 DIAGNOSIS — M9902 Segmental and somatic dysfunction of thoracic region: Secondary | ICD-10-CM | POA: Diagnosis not present

## 2022-08-29 DIAGNOSIS — M62830 Muscle spasm of back: Secondary | ICD-10-CM | POA: Diagnosis not present

## 2022-08-29 DIAGNOSIS — M9901 Segmental and somatic dysfunction of cervical region: Secondary | ICD-10-CM | POA: Diagnosis not present

## 2022-09-02 DIAGNOSIS — M62830 Muscle spasm of back: Secondary | ICD-10-CM | POA: Diagnosis not present

## 2022-09-02 DIAGNOSIS — M9902 Segmental and somatic dysfunction of thoracic region: Secondary | ICD-10-CM | POA: Diagnosis not present

## 2022-09-02 DIAGNOSIS — M9901 Segmental and somatic dysfunction of cervical region: Secondary | ICD-10-CM | POA: Diagnosis not present

## 2022-09-02 DIAGNOSIS — M9903 Segmental and somatic dysfunction of lumbar region: Secondary | ICD-10-CM | POA: Diagnosis not present

## 2022-09-04 DIAGNOSIS — M9902 Segmental and somatic dysfunction of thoracic region: Secondary | ICD-10-CM | POA: Diagnosis not present

## 2022-09-04 DIAGNOSIS — M62830 Muscle spasm of back: Secondary | ICD-10-CM | POA: Diagnosis not present

## 2022-09-04 DIAGNOSIS — M9901 Segmental and somatic dysfunction of cervical region: Secondary | ICD-10-CM | POA: Diagnosis not present

## 2022-09-04 DIAGNOSIS — M9903 Segmental and somatic dysfunction of lumbar region: Secondary | ICD-10-CM | POA: Diagnosis not present

## 2022-09-10 DIAGNOSIS — M9903 Segmental and somatic dysfunction of lumbar region: Secondary | ICD-10-CM | POA: Diagnosis not present

## 2022-09-10 DIAGNOSIS — M9901 Segmental and somatic dysfunction of cervical region: Secondary | ICD-10-CM | POA: Diagnosis not present

## 2022-09-10 DIAGNOSIS — M9902 Segmental and somatic dysfunction of thoracic region: Secondary | ICD-10-CM | POA: Diagnosis not present

## 2022-09-10 DIAGNOSIS — M62830 Muscle spasm of back: Secondary | ICD-10-CM | POA: Diagnosis not present

## 2022-09-12 DIAGNOSIS — M62830 Muscle spasm of back: Secondary | ICD-10-CM | POA: Diagnosis not present

## 2022-09-12 DIAGNOSIS — M9901 Segmental and somatic dysfunction of cervical region: Secondary | ICD-10-CM | POA: Diagnosis not present

## 2022-09-12 DIAGNOSIS — M9903 Segmental and somatic dysfunction of lumbar region: Secondary | ICD-10-CM | POA: Diagnosis not present

## 2022-09-12 DIAGNOSIS — M9902 Segmental and somatic dysfunction of thoracic region: Secondary | ICD-10-CM | POA: Diagnosis not present

## 2022-09-17 DIAGNOSIS — M9901 Segmental and somatic dysfunction of cervical region: Secondary | ICD-10-CM | POA: Diagnosis not present

## 2022-09-17 DIAGNOSIS — M9902 Segmental and somatic dysfunction of thoracic region: Secondary | ICD-10-CM | POA: Diagnosis not present

## 2022-09-17 DIAGNOSIS — M9903 Segmental and somatic dysfunction of lumbar region: Secondary | ICD-10-CM | POA: Diagnosis not present

## 2022-09-17 DIAGNOSIS — M62830 Muscle spasm of back: Secondary | ICD-10-CM | POA: Diagnosis not present

## 2022-09-19 DIAGNOSIS — M9902 Segmental and somatic dysfunction of thoracic region: Secondary | ICD-10-CM | POA: Diagnosis not present

## 2022-09-19 DIAGNOSIS — M9903 Segmental and somatic dysfunction of lumbar region: Secondary | ICD-10-CM | POA: Diagnosis not present

## 2022-09-19 DIAGNOSIS — M9901 Segmental and somatic dysfunction of cervical region: Secondary | ICD-10-CM | POA: Diagnosis not present

## 2022-09-19 DIAGNOSIS — M62830 Muscle spasm of back: Secondary | ICD-10-CM | POA: Diagnosis not present

## 2022-09-23 DIAGNOSIS — M9903 Segmental and somatic dysfunction of lumbar region: Secondary | ICD-10-CM | POA: Diagnosis not present

## 2022-09-23 DIAGNOSIS — M9901 Segmental and somatic dysfunction of cervical region: Secondary | ICD-10-CM | POA: Diagnosis not present

## 2022-09-23 DIAGNOSIS — M9902 Segmental and somatic dysfunction of thoracic region: Secondary | ICD-10-CM | POA: Diagnosis not present

## 2022-09-23 DIAGNOSIS — M62830 Muscle spasm of back: Secondary | ICD-10-CM | POA: Diagnosis not present

## 2022-09-30 ENCOUNTER — Encounter: Payer: Self-pay | Admitting: Nurse Practitioner

## 2022-09-30 ENCOUNTER — Telehealth (INDEPENDENT_AMBULATORY_CARE_PROVIDER_SITE_OTHER): Payer: Medicaid Other | Admitting: Nurse Practitioner

## 2022-09-30 DIAGNOSIS — F900 Attention-deficit hyperactivity disorder, predominantly inattentive type: Secondary | ICD-10-CM | POA: Diagnosis not present

## 2022-09-30 MED ORDER — ATOMOXETINE HCL 40 MG PO CAPS: 40.0000 mg | ORAL_CAPSULE | Freq: Every day | ORAL | 0 refills | Status: DC

## 2022-09-30 NOTE — Patient Instructions (Signed)
Atomoxetine Capsules What is this medication? ATOMOXETINE (AT oh mox e teen) treats attention-deficit hyperactivity disorder (ADHD). It works by improving focus and reducing impulsive behavior. It belongs to a group of medications called SNRIs. This medicine may be used for other purposes; ask your health care provider or pharmacist if you have questions. COMMON BRAND NAME(S): Strattera What should I tell my care team before I take this medication? They need to know if you have any of these conditions: Glaucoma High or low blood pressure History of stroke Irregular heartbeat or other cardiac disease Liver disease Mania or bipolar disorder Pheochromocytoma Suicidal thoughts An unusual or allergic reaction to atomoxetine, other medications, foods, dyes, or preservatives Pregnant or trying to get pregnant Breast-feeding How should I use this medication? Take this medication by mouth with a glass of water. Follow the directions on the prescription label. You can take it with or without food. If it upsets your stomach, take it with food. If you have difficulty sleeping, and you take more than 1 dose per day, take your last dose before 6 PM. Take your medication at regular intervals. Do not take it more often than directed. Do not stop taking except on your care team's advice. A special MedGuide will be given to you by the pharmacist with each prescription and refill. Be sure to read this information carefully each time. Talk to your care team regarding the use of this medication in children. While this medication may be prescribed for children as young as 6 years for selected conditions, precautions do apply. Overdosage: If you think you have taken too much of this medicine contact a poison control center or emergency room at once. NOTE: This medicine is only for you. Do not share this medicine with others. What if I miss a dose? If you miss a dose, take it as soon as you can. If it is almost  time for your next dose, take only that dose. Do not take double or extra doses. What may interact with this medication? Do not take this medication with any of the following: Cisapride Dronedarone MAOIs like Carbex, Eldepryl, Marplan, Nardil, and Parnate Pimozide Reboxetine Thioridazine This medication may also interact with the following: Certain medications for blood pressure, heart disease, irregular heart beat Certain medications for depression, anxiety, or psychotic disturbances Certain medications for lung disease like albuterol Cold or allergy medications Dofetilide Fluoxetine Medications that increase blood pressure like dopamine, dobutamine, or ephedrine Other medications that prolong the QT interval (cause an abnormal heart rhythm) Paroxetine Quinidine Stimulant medications for attention disorders, weight loss, or to stay awake Ziprasidone This list may not describe all possible interactions. Give your health care provider a list of all the medicines, herbs, non-prescription drugs, or dietary supplements you use. Also tell them if you smoke, drink alcohol, or use illegal drugs. Some items may interact with your medicine. What should I watch for while using this medication? It may take a week or more for this medication to take effect. This is why it is very important to continue taking the medication and not miss any doses. If you have been taking this medication regularly for some time, do not suddenly stop taking it. Ask your care team for advice. Rarely, this medication may increase thoughts of suicide or suicide attempts in children and teenagers. Call your child's health care professional right away if your child or teenager has new or increased thoughts of suicide or has changes in mood or behavior like becoming irritable or  anxious. Regularly monitor your child for these behavioral changes. For males, contact you care team right away if you have an erection that lasts  longer than 4 hours or if it becomes painful. This may be a sign of serious problem and must be treated right away to prevent permanent damage. You may get drowsy or dizzy. Do not drive, use machinery, or do anything that needs mental alertness until you know how this medication affects you. Do not stand or sit up quickly, especially if you are an older patient. This reduces the risk of dizzy or fainting spells. Alcohol can make you more drowsy and dizzy. Avoid alcoholic drinks. Do not treat yourself for coughs, colds or allergies without asking your care team for advice. Some ingredients can increase possible side effects. Your mouth may get dry. Chewing sugarless gum or sucking hard candy, and drinking plenty of water will help. What side effects may I notice from receiving this medication? Side effects that you should report to your care team as soon as possible: Allergic reactions--skin rash, itching, hives, swelling of the face, lips, tongue, or throat Heart rhythm changes--fast or irregular heartbeat, dizziness, feeling faint or lightheaded, chest pain, trouble breathing Increase in blood pressure Liver injury-- right upper belly pain, loss of appetite, nausea, light-colored stool, dark yellow or brown urine, yellowing skin or eyes, unusual weakness or fatigue Mood and behavior changes--anxiety, nervousness, confusion, hallucinations, irritability, hostility, thoughts of suicide or self-harm, worsening mood, feelings of depression Painful or prolonged erection Stroke in adults--sudden numbness or weakness of the face, arm, or leg, trouble speaking, confusion, trouble walking, loss of balance or coordination, dizziness, severe headache, change in vision Trouble passing urine Side effects that usually do not require medical attention (report to your care team if they continue or are bothersome): Change in sex drive or performance Constipation Dizziness Dry mouth Loss of  appetite Nausea Stomach pain Vomiting This list may not describe all possible side effects. Call your doctor for medical advice about side effects. You may report side effects to FDA at 1-800-FDA-1088. Where should I keep my medication? Keep out of the reach of children and pets. Store at room temperature between 15 and 30 degrees C (59 and 86 degrees F). Throw away any unused medication after the expiration date. NOTE: This sheet is a summary. It may not cover all possible information. If you have questions about this medicine, talk to your doctor, pharmacist, or health care provider.  2023 Elsevier/Gold Standard (2005-01-07 00:00:00)

## 2022-09-30 NOTE — Progress Notes (Signed)
Virtual Visit Consent   Jasmine Velazquez, you are scheduled for a virtual visit with Jasmine Daphine Deutscher, FNP, a [facility] provider, today.     Just as with appointments in the office, your consent must be obtained to participate.  Your consent will be active for this visit and any virtual visit you may have with one of our providers in the next 365 days.     If you have a MyChart account, a copy of this consent can be sent to you electronically.  All virtual visits are billed to your insurance company just like a traditional visit in the office.    As this is a virtual visit, video technology does not allow for your provider to perform a traditional examination.  This may limit your provider's ability to fully assess your condition.  If your provider identifies any concerns that need to be evaluated in person or the need to arrange testing (such as labs, EKG, etc.), we will make arrangements to do so.     Although advances in technology are sophisticated, we cannot ensure that it will always work on either your end or our end.  If the connection with a video visit is poor, the visit may have to be switched to a telephone visit.  With either a video or telephone visit, we are not always able to ensure that we have a secure connection.     I need to obtain your verbal consent now.   Are you willing to proceed with your visit today? YES   Mayrene Bastarache has provided verbal consent on 09/30/2022 for a virtual visit (video or telephone).   Jasmine Daphine Deutscher, FNP   Date: 09/30/2022 11:29 AM   Virtual Visit via Video Note   I, Jasmine Velazquez, connected with Jasmine Velazquez ([id number], 07-14-2001) on 09/30/22 at 11:15 AM EDT by a video-enabled telemedicine application and verified that I am speaking with the correct person using two identifiers.  Location: Patient: Virtual Visit Location Patient: Home Provider: Virtual Visit Location Provider: Mobile   I discussed the limitations  of evaluation and management by telemedicine and the availability of in person appointments. The patient expressed understanding and agreed to proceed.    History of Present Illness: Jasmine Velazquez is a 21 y.o. who identifies as a female who was assigned female at birth, and is being seen today for ADHD.  HPI: Patient is was on adderall and she says it is not working as well as vyvanse was to keep her focused. But vyvanse made her very moody.     Review of Systems  Constitutional:  Negative for diaphoresis and weight loss.  Eyes:  Negative for blurred vision, double vision and pain.  Respiratory:  Negative for shortness of breath.   Cardiovascular:  Negative for chest pain, palpitations, orthopnea and leg swelling.  Gastrointestinal:  Negative for abdominal pain.  Skin:  Negative for rash.  Neurological:  Negative for dizziness, sensory change, loss of consciousness, weakness and headaches.  Endo/Heme/Allergies:  Negative for polydipsia. Does not bruise/bleed easily.  Psychiatric/Behavioral:  Negative for memory loss. The patient does not have insomnia.   All other systems reviewed and are negative.   Problems:  Patient Active Problem List   Diagnosis Date Noted   Attention deficit hyperactivity disorder (ADHD), predominantly inattentive type 03/15/2021   Mouth sore 09/08/2020   Dysmenorrhea in adolescent 05/18/2019   Acne vulgaris 05/18/2019   Left shoulder pain 09/08/2015    Allergies: No Known Allergies Medications:  Current Outpatient  Medications:    amphetamine-dextroamphetamine (ADDERALL) 20 MG tablet, Take 1 tablet (20 mg total) by mouth 2 (two) times daily., Disp: 60 tablet, Rfl: 0   lisdexamfetamine (VYVANSE) 40 MG capsule, Take 1 capsule (40 mg total) by mouth every morning., Disp: 30 capsule, Rfl: 0   lisdexamfetamine (VYVANSE) 40 MG capsule, Take 1 capsule (40 mg total) by mouth every morning., Disp: 30 capsule, Rfl: 0   lisdexamfetamine (VYVANSE) 40 MG capsule, Take 1  capsule (40 mg total) by mouth every morning., Disp: 30 capsule, Rfl: 0   norgestimate-ethinyl estradiol (ORTHO-CYCLEN) 0.25-35 MG-MCG tablet, Take 1 tablet by mouth daily. (NEEDS TO BE SEEN BEFORE NEXT REFILL), Disp: 28 tablet, Rfl: 7  Observations/Objective: Patient is well-developed, well-nourished in no acute distress.  Resting comfortably  at home.  Head is normocephalic, atraumatic.  No labored breathing.  Speech is clear and coherent with logical content.  Patient is alert and oriented at baseline.    Assessment and Plan:  Jasmine Velazquez in today with chief complaint of No chief complaint on file.   1. Attention deficit hyperactivity disorder (ADHD), predominantly inattentive type After long discussion- we decided to try strattera and see if that will help her stay focused without side effects. Patient will let me know how sheis doing in the next week or so.   Follow Up Instructions: I discussed the assessment and treatment plan with the patient. The patient was provided an opportunity to ask questions and all were answered. The patient agreed with the plan and demonstrated an understanding of the instructions.  A copy of instructions were sent to the patient via MyChart.  The patient was advised to call back or seek an in-person evaluation if the symptoms worsen or if the condition fails to improve as anticipated.  Time:  I spent 6 minutes with the patient via telehealth technology discussing the above problems/concerns.    Jasmine Hassell Done, FNP

## 2022-10-01 DIAGNOSIS — M9901 Segmental and somatic dysfunction of cervical region: Secondary | ICD-10-CM | POA: Diagnosis not present

## 2022-10-01 DIAGNOSIS — M9903 Segmental and somatic dysfunction of lumbar region: Secondary | ICD-10-CM | POA: Diagnosis not present

## 2022-10-01 DIAGNOSIS — M62830 Muscle spasm of back: Secondary | ICD-10-CM | POA: Diagnosis not present

## 2022-10-01 DIAGNOSIS — M9902 Segmental and somatic dysfunction of thoracic region: Secondary | ICD-10-CM | POA: Diagnosis not present

## 2022-10-08 DIAGNOSIS — M9901 Segmental and somatic dysfunction of cervical region: Secondary | ICD-10-CM | POA: Diagnosis not present

## 2022-10-08 DIAGNOSIS — M9902 Segmental and somatic dysfunction of thoracic region: Secondary | ICD-10-CM | POA: Diagnosis not present

## 2022-10-08 DIAGNOSIS — M9903 Segmental and somatic dysfunction of lumbar region: Secondary | ICD-10-CM | POA: Diagnosis not present

## 2022-10-08 DIAGNOSIS — M62830 Muscle spasm of back: Secondary | ICD-10-CM | POA: Diagnosis not present

## 2022-10-08 MED ORDER — LISDEXAMFETAMINE DIMESYLATE 50 MG PO CAPS
50.0000 mg | ORAL_CAPSULE | Freq: Every day | ORAL | 0 refills | Status: DC
Start: 2022-10-08 — End: 2022-10-17

## 2022-10-08 NOTE — Telephone Encounter (Signed)
Vyvanse sent to [facility] in [place]

## 2022-10-14 DIAGNOSIS — M62830 Muscle spasm of back: Secondary | ICD-10-CM | POA: Diagnosis not present

## 2022-10-14 DIAGNOSIS — M9902 Segmental and somatic dysfunction of thoracic region: Secondary | ICD-10-CM | POA: Diagnosis not present

## 2022-10-14 DIAGNOSIS — M9901 Segmental and somatic dysfunction of cervical region: Secondary | ICD-10-CM | POA: Diagnosis not present

## 2022-10-14 DIAGNOSIS — M9903 Segmental and somatic dysfunction of lumbar region: Secondary | ICD-10-CM | POA: Diagnosis not present

## 2022-10-17 ENCOUNTER — Other Ambulatory Visit: Payer: Self-pay | Admitting: Nurse Practitioner

## 2022-10-17 MED ORDER — LISDEXAMFETAMINE DIMESYLATE 50 MG PO CAPS: 50.0000 mg | ORAL_CAPSULE | Freq: Every day | ORAL | 0 refills | Status: DC

## 2022-10-17 NOTE — Progress Notes (Signed)
Vyvanse sent to [facility]

## 2022-10-29 DIAGNOSIS — M9903 Segmental and somatic dysfunction of lumbar region: Secondary | ICD-10-CM | POA: Diagnosis not present

## 2022-10-29 DIAGNOSIS — M9901 Segmental and somatic dysfunction of cervical region: Secondary | ICD-10-CM | POA: Diagnosis not present

## 2022-10-29 DIAGNOSIS — M9902 Segmental and somatic dysfunction of thoracic region: Secondary | ICD-10-CM | POA: Diagnosis not present

## 2022-10-29 DIAGNOSIS — M62830 Muscle spasm of back: Secondary | ICD-10-CM | POA: Diagnosis not present

## 2022-11-12 DIAGNOSIS — M9903 Segmental and somatic dysfunction of lumbar region: Secondary | ICD-10-CM | POA: Diagnosis not present

## 2022-11-12 DIAGNOSIS — M9902 Segmental and somatic dysfunction of thoracic region: Secondary | ICD-10-CM | POA: Diagnosis not present

## 2022-11-12 DIAGNOSIS — M9901 Segmental and somatic dysfunction of cervical region: Secondary | ICD-10-CM | POA: Diagnosis not present

## 2022-11-12 DIAGNOSIS — M62830 Muscle spasm of back: Secondary | ICD-10-CM | POA: Diagnosis not present

## 2022-11-14 ENCOUNTER — Ambulatory Visit: Payer: Medicaid Other | Admitting: Nurse Practitioner

## 2022-11-15 ENCOUNTER — Ambulatory Visit: Payer: Medicaid Other | Admitting: Nurse Practitioner

## 2022-11-19 ENCOUNTER — Encounter: Payer: Self-pay | Admitting: Nurse Practitioner

## 2022-11-19 ENCOUNTER — Other Ambulatory Visit (HOSPITAL_COMMUNITY): Payer: Self-pay

## 2022-11-19 ENCOUNTER — Ambulatory Visit (INDEPENDENT_AMBULATORY_CARE_PROVIDER_SITE_OTHER): Payer: Medicaid Other | Admitting: Nurse Practitioner

## 2022-11-19 VITALS — BP 110/75 | HR 86 | Temp 97.9°F | Resp 20 | Ht 63.0 in | Wt 113.0 lb

## 2022-11-19 DIAGNOSIS — F900 Attention-deficit hyperactivity disorder, predominantly inattentive type: Secondary | ICD-10-CM | POA: Diagnosis not present

## 2022-11-19 MED ORDER — LISDEXAMFETAMINE DIMESYLATE 50 MG PO CAPS: 50.0000 mg | ORAL_CAPSULE | Freq: Every day | ORAL | 0 refills | Status: DC

## 2022-11-19 NOTE — Progress Notes (Signed)
Subjective:    Patient ID: Jasmine Velazquez, female    DOB: [birth year], 21 y.o.   MRN: [id number]   Chief Complaint: ADHD   HPI:  Jasmine Velazquez is a 21 y.o. who identifies as a female who was assigned female at birth.   Social history: Lives with: dad Work history: college at [employer]   Comes in today for follow up of the following chronic medical issues:  1. Attention deficit hyperactivity disorder (ADHD), predominantly inattentive type Patient is currently on vyvanse 50mg  daily. Says she is doing well. Helps her to concentrate at school. Denies any medication side effects.   New complaints: None today  No Known Allergies Outpatient Encounter Medications as of 11/19/2022  Medication Sig   lisdexamfetamine (VYVANSE) 50 MG capsule Take 1 capsule (50 mg total) by mouth daily.   norgestimate-ethinyl estradiol (ORTHO-CYCLEN) 0.25-35 MG-MCG tablet Take 1 tablet by mouth daily. (NEEDS TO BE SEEN BEFORE NEXT REFILL)   No facility-administered encounter medications on file as of 11/19/2022.    Past Surgical History:  Procedure Laterality Date   TONSILLECTOMY AND ADENOIDECTOMY Bilateral 06/11/2019   Procedure: TONSILLECTOMY AND ADENOIDECTOMY;  Surgeon: 06/13/2019, MD;  Location: [facility];  Service: ENT;  Laterality: Bilateral;    Family History  Problem Relation Age of Onset   Hypertension Mother    Liver disease Mother    Other Sister        Cyclic Vomiting Syndrome      Controlled substance contract: n/a     Review of Systems  Constitutional:  Negative for diaphoresis.  Eyes:  Negative for pain.  Respiratory:  Negative for shortness of breath.   Cardiovascular:  Negative for chest pain, palpitations and leg swelling.  Gastrointestinal:  Negative for abdominal pain.  Endocrine: Negative for polydipsia.  Skin:  Negative for rash.  Neurological:  Negative for dizziness, weakness and headaches.  Hematological:  Does not bruise/bleed easily.  All other  systems reviewed and are negative.      Objective:   Physical Exam Vitals and nursing note reviewed.  Constitutional:      General: She is not in acute distress.    Appearance: Normal appearance. She is well-developed.  Neck:     Vascular: No carotid bruit or JVD.  Cardiovascular:     Rate and Rhythm: Normal rate and regular rhythm.     Heart sounds: Normal heart sounds.  Pulmonary:     Effort: Pulmonary effort is normal. No respiratory distress.     Breath sounds: Normal breath sounds. No wheezing or rales.  Chest:     Chest wall: No tenderness.  Abdominal:     General: Bowel sounds are normal. There is no distension or abdominal bruit.     Palpations: Abdomen is soft. There is no hepatomegaly, splenomegaly, mass or pulsatile mass.     Tenderness: There is no abdominal tenderness.  Musculoskeletal:        General: Normal range of motion.     Cervical back: Normal range of motion and neck supple.  Lymphadenopathy:     Cervical: No cervical adenopathy.  Skin:    General: Skin is warm and dry.  Neurological:     Mental Status: She is alert and oriented to person, place, and time.     Deep Tendon Reflexes: Reflexes are normal and symmetric.  Psychiatric:        Behavior: Behavior normal.        Thought Content: Thought content normal.  Judgment: Judgment normal.    BP 110/75   Pulse 86   Temp 97.9 F (36.6 C) (Temporal)   Resp 20   Ht 5\' 3"  (1.6 m)   Wt 113 lb (51.3 kg)   SpO2 100%   BMI 20.02 kg/m         Assessment & Plan:    in today with chief complaint of ADHD   1. Attention deficit hyperactivity disorder (ADHD), predominantly inattentive type Stress management  Meds ordered this encounter  Medications   lisdexamfetamine (VYVANSE) 50 MG capsule    Sig: Take 1 capsule (50 mg total) by mouth daily.    Dispense:  30 capsule    Refill:  0    Order Specific Question:   Supervising Provider    Answer:   Jasmine Velazquez A  [[id number]]   lisdexamfetamine (VYVANSE) 50 MG capsule    Sig: Take 1 capsule (50 mg total) by mouth daily.    Dispense:  30 capsule    Refill:  0    Order Specific Question:   Supervising Provider    Answer:   Arville Care A [[id number]]   lisdexamfetamine (VYVANSE) 50 MG capsule    Sig: Take 1 capsule (50 mg total) by mouth daily.    Dispense:  30 capsule    Refill:  0    Order Specific Question:   Supervising Provider    Answer:   Arville Care A [[id number]]        The above assessment and management plan was discussed with the patient. The patient verbalized understanding of and has agreed to the management plan. Patient is aware to call the clinic if symptoms persist or worsen. Patient is aware when to return to the clinic for a follow-up visit. Patient educated on when it is appropriate to go to the emergency department.   Mary-Margaret Arville Care, FNP

## 2022-11-26 DIAGNOSIS — M9903 Segmental and somatic dysfunction of lumbar region: Secondary | ICD-10-CM | POA: Diagnosis not present

## 2022-11-26 DIAGNOSIS — M62830 Muscle spasm of back: Secondary | ICD-10-CM | POA: Diagnosis not present

## 2022-11-26 DIAGNOSIS — M9901 Segmental and somatic dysfunction of cervical region: Secondary | ICD-10-CM | POA: Diagnosis not present

## 2022-11-26 DIAGNOSIS — M9902 Segmental and somatic dysfunction of thoracic region: Secondary | ICD-10-CM | POA: Diagnosis not present

## 2022-12-31 NOTE — Telephone Encounter (Signed)
We do not have any coupons. You may can get one on line at [email or web address]

## 2023-02-08 MED ORDER — LISDEXAMFETAMINE DIMESYLATE 50 MG PO CAPS: 50.0000 mg | ORAL_CAPSULE | Freq: Every day | ORAL | 0 refills | Status: DC

## 2023-02-10 MED ORDER — LISDEXAMFETAMINE DIMESYLATE 50 MG PO CAPS: 50.0000 mg | ORAL_CAPSULE | Freq: Every day | ORAL | 0 refills | Status: DC

## 2023-02-10 NOTE — Addendum Note (Signed)
Addended by: Chevis Pretty on: 02/10/2023 04:36 PM   Modules accepted: Orders

## 2023-02-14 ENCOUNTER — Encounter: Payer: Self-pay | Admitting: Nurse Practitioner

## 2023-02-14 ENCOUNTER — Ambulatory Visit (INDEPENDENT_AMBULATORY_CARE_PROVIDER_SITE_OTHER): Payer: Medicaid Other | Admitting: Nurse Practitioner

## 2023-02-14 VITALS — BP 140/86 | HR 115 | Temp 98.1°F | Resp 20 | Ht 63.0 in | Wt 114.0 lb

## 2023-02-14 DIAGNOSIS — F900 Attention-deficit hyperactivity disorder, predominantly inattentive type: Secondary | ICD-10-CM | POA: Diagnosis not present

## 2023-02-14 DIAGNOSIS — F419 Anxiety disorder, unspecified: Secondary | ICD-10-CM

## 2023-02-14 MED ORDER — LISDEXAMFETAMINE DIMESYLATE 50 MG PO CAPS: 50.0000 mg | ORAL_CAPSULE | Freq: Every day | ORAL | 0 refills | Status: DC

## 2023-02-14 MED ORDER — CITALOPRAM HYDROBROMIDE 20 MG PO TABS: 20.0000 mg | ORAL_TABLET | Freq: Every day | ORAL | 3 refills | Status: DC

## 2023-02-14 NOTE — Progress Notes (Signed)
Subjective:    Patient ID: Jasmine Velazquez, female    DOB: 11/23/2001, 22 y.o.   MRN: [id number]   Chief Complaint: ADHD and Discuss anxiety   HPI Patient comes  in to discuss: - adhd- patient is currently on vyvanse '50mg'$  daily. She has had some toroubke getting meds. She has had no medication side effects. - anxiety- says she gets very anxious at times and feels like she wants to  throw up. She doesn't want to go and do  things.     02/14/2023   11:57 AM 11/19/2022    8:34 AM 08/13/2022    8:04 AM  Depression screen PHQ 2/9  Decreased Interest 0 0 0  Down, Depressed, Hopeless 0 0 0  PHQ - 2 Score 0 0 0  Altered sleeping 0 0 0  Tired, decreased energy 0 0 0  Change in appetite 0 0 0  Feeling bad or failure about yourself  0 0 0  Trouble concentrating 0 0 0  Moving slowly or fidgety/restless 0 0 0  Suicidal thoughts 0 0 0  PHQ-9 Score 0 0 0  Difficult doing work/chores Not difficult at all Not difficult at all Not difficult at all      02/14/2023   11:57 AM 11/19/2022    8:34 AM 08/13/2022    8:05 AM 05/01/2022    8:16 AM  GAD 7 : Generalized Anxiety Score  Nervous, Anxious, on Edge 3 0 3 2  Control/stop worrying 3 0 1 2  Worry too much - different things 3 0 3 2  Trouble relaxing 3 0 3 2  Restless 3 0 3 2  Easily annoyed or irritable 3 0 3 0  Afraid - awful might happen 3 0 1 2  Total GAD 7 Score 21 0 17 12  Anxiety Difficulty Not difficult at all Not difficult at all Not difficult at all Somewhat difficult      Patient Active Problem List   Diagnosis Date Noted   Attention deficit hyperactivity disorder (ADHD), predominantly inattentive type 03/15/2021   Mouth sore 09/08/2020   Dysmenorrhea in adolescent 05/18/2019   Acne vulgaris 05/18/2019   Left shoulder pain 09/08/2015       Review of Systems  Constitutional:  Negative for diaphoresis.  Eyes:  Negative for pain.  Respiratory:  Negative for shortness of breath.   Cardiovascular:  Negative for chest  pain, palpitations and leg swelling.  Gastrointestinal:  Negative for abdominal pain.  Endocrine: Negative for polydipsia.  Skin:  Negative for rash.  Neurological:  Negative for dizziness, weakness and headaches.  Hematological:  Does not bruise/bleed easily.  All other systems reviewed and are negative.      Objective:   Physical Exam Vitals and nursing note reviewed.  Constitutional:      General: She is not in acute distress.    Appearance: Normal appearance. She is well-developed.  HENT:     Head: Normocephalic.     Right Ear: Tympanic membrane normal.     Left Ear: Tympanic membrane normal.     Nose: Nose normal.     Mouth/Throat:     Mouth: Mucous membranes are moist.  Eyes:     Pupils: Pupils are equal, round, and reactive to light.  Neck:     Vascular: No carotid bruit or JVD.  Cardiovascular:     Rate and Rhythm: Normal rate and regular rhythm.     Heart sounds: Normal heart sounds.  Pulmonary:  Effort: Pulmonary effort is normal. No respiratory distress.     Breath sounds: Normal breath sounds. No wheezing or rales.  Chest:     Chest wall: No tenderness.  Abdominal:     General: Bowel sounds are normal. There is no distension or abdominal bruit.     Palpations: Abdomen is soft. There is no hepatomegaly, splenomegaly, mass or pulsatile mass.     Tenderness: There is no abdominal tenderness.  Musculoskeletal:        General: Normal range of motion.     Cervical back: Normal range of motion and neck supple.  Lymphadenopathy:     Cervical: No cervical adenopathy.  Skin:    General: Skin is warm and dry.  Neurological:     Mental Status: She is alert and oriented to person, place, and time.     Deep Tendon Reflexes: Reflexes are normal and symmetric.  Psychiatric:        Behavior: Behavior normal.        Thought Content: Thought content normal.        Judgment: Judgment normal.    BP (!) 140/86   Pulse (!) 115   Temp 98.1 F (36.7 C) (Temporal)    Resp 20   Ht '5\' 3"'$  (1.6 m)   Wt 114 lb (51.7 kg)   SpO2 100%   BMI 20.19 kg/m         Assessment & Plan:  Jasmine Velazquez in today with chief complaint of ADHD and Discuss anxiety   1. Attention deficit hyperactivity disorder (ADHD), predominantly inattentive type Stress management - lisdexamfetamine (VYVANSE) 50 MG capsule; Take 1 capsule (50 mg total) by mouth daily.  Dispense: 30 capsule; Refill: 0 - lisdexamfetamine (VYVANSE) 50 MG capsule; Take 1 capsule (50 mg total) by mouth daily.  Dispense: 30 capsule; Refill: 0 - lisdexamfetamine (VYVANSE) 50 MG capsule; Take 1 capsule (50 mg total) by mouth daily.  Dispense: 30 capsule; Refill: 0  2. Anxiety Deep breathing exercises Medication side effects discussed - citalopram (CELEXA) 20 MG tablet; Take 1 tablet (20 mg total) by mouth daily.  Dispense: 30 tablet; Refill: 3    The above assessment and management plan was discussed with the patient. The patient verbalized understanding of and has agreed to the management plan. Patient is aware to call the clinic if symptoms persist or worsen. Patient is aware when to return to the clinic for a follow-up visit. Patient educated on when it is appropriate to go to the emergency department.   [name], FNP

## 2023-02-14 NOTE — Patient Instructions (Signed)
Citalopram Solution What is this medication? CITALOPRAM (sye TAL oh pram) treats depression. It increases the amount of serotonin in the brain, a hormone that helps regulate mood. It belongs to a group of medications called SSRIs. This medicine may be used for other purposes; ask your health care provider or pharmacist if you have questions. COMMON BRAND NAME(S): Celexa What should I tell my care team before I take this medication? They need to know if you have any of these conditions: Bipolar disorder or a family history of bipolar disorder Bleeding disorders Glaucoma Heart disease History of irregular heartbeat Kidney disease Liver disease Low levels of magnesium or potassium in the blood Receiving electroconvulsive therapy Seizures Suicidal thoughts, plans, or attempt; a previous suicide attempt by you or a family member Take medications that treat or prevent blood clots Thyroid disease An unusual or allergic reaction to citalopram, escitalopram, other medications, foods, dyes, or preservatives Pregnant or trying to become pregnant Breast-feeding How should I use this medication? Take this medication by mouth. Follow the directions on the prescription label. Use a specially marked spoon or container to measure your medication. Ask your pharmacist if you do not have one. Household spoons are not accurate. This medication can be taken with or without food. Take your medication at regular intervals. Do not take your medication more often than directed. Do not stop taking this medication suddenly except upon the advice of your care team. Stopping this medication too quickly may cause serious side effects or your condition may worsen. A special MedGuide will be given to you by the pharmacist with each prescription and refill. Be sure to read this information carefully each time. Talk to your care team about the use of this medication in children. Special care may be needed. Patients over 38  years old may have a stronger reaction and need a smaller dose. Overdosage: If you think you have taken too much of this medicine contact a poison control center or emergency room at once. NOTE: This medicine is only for you. Do not share this medicine with others. What if I miss a dose? If you miss a dose, take it as soon as you can. If it is almost time for your next dose, take only that dose. Do not take double or extra doses. What may interact with this medication? Do not take this medication with any of the following: Certain medications for fungal infections like fluconazole, itraconazole, ketoconazole, posaconazole, voriconazole Cisapride Dronedarone Escitalopram Linezolid MAOIs like Carbex, Eldepryl, Marplan, Nardil, and Parnate Methylene blue (injected into a vein) Pimozide Thioridazine This medication may also interact with the following: Alcohol Amphetamines Aspirin and aspirin-like medications Carbamazepine Certain medications for depression, anxiety, or psychotic disturbances Certain medications for infections like chloroquine, clarithromycin, erythromycin, furazolidone, isoniazid, pentamidine Certain medications for migraine headaches like almotriptan, eletriptan, frovatriptan, naratriptan, rizatriptan, sumatriptan, zolmitriptan Certain medications for sleep Certain medications that treat or prevent blood clots like dalteparin, enoxaparin, warfarin Cimetidine Diuretics Dofetilide Fentanyl Lithium Methadone Metoprolol NSAIDs, medications for pain and inflammation, like ibuprofen or naproxen Omeprazole Other medications that prolong the QT interval (cause an abnormal heart rhythm) Procarbazine Rasagiline Supplements like St. John's wort, kava kava, valerian Tramadol Tryptophan Ziprasidone This list may not describe all possible interactions. Give your health care provider a list of all the medicines, herbs, non-prescription drugs, or dietary supplements you use.  Also tell them if you smoke, drink alcohol, or use illegal drugs. Some items may interact with your medicine. What should I watch for while  using this medication? Tell your care team if your symptoms do not get better or if they get worse. Visit your care team for regular checks on your progress. Because it may take several weeks to see the full effects of this medication, it is important to continue your treatment as prescribed. Watch for new or worsening thoughts of suicide or depression. This includes sudden changes in mood, behavior, or thoughts. These changes can happen at any time but are more common in the beginning of treatment or after a change in dose. Call your care team right away if you experience these thoughts or worsening depression. Manic episodes may happen in patients with bipolar disorder who take this medication. Watch for changes in feelings or behaviors such as feeling anxious, nervous, agitated, panicky, irritable, hostile, aggressive, impulsive, severely restless, overly excited and hyperactive, or trouble sleeping. These symptoms can happen at anytime but are more common in the beginning of treatment or after a change in dose. Call you care team right away if you notice any of these symptoms. You may get drowsy or dizzy. Do not drive, use machinery, or do anything that needs mental alertness until you know how this medication affects you. Do not stand or sit up quickly, especially if you are an older patient. This reduces the risk of dizzy or fainting spells. Alcohol may interfere with the effect of this medication. Avoid alcoholic drinks. Your mouth may get dry. Chewing sugarless gum or sucking hard candy, and drinking plenty of water may help. Contact your care team if the problem does not go away or is severe. What side effects may I notice from receiving this medication? Side effects that you should report to your care team as soon as possible: Allergic reactions--skin rash,  itching, hives, swelling of the face, lips, tongue, or throat Bleeding--bloody or black, tar-like stools, red or dark brown urine, vomiting blood or brown material that looks like coffee grounds, small, red or purple spots on skin, unusual bleeding or bruising Heart rhythm changes--fast or irregular heartbeat, dizziness, feeling faint or lightheaded, chest pain, trouble breathing Low sodium level--muscle weakness, fatigue, dizziness, headache, confusion Serotonin syndrome--irritability, confusion, fast or irregular heartbeat, muscle stiffness, twitching muscles, sweating, high fever, seizure, chills, vomiting, diarrhea Sudden eye pain or change in vision such as blurry vision, seeing halos around lights, vision loss Thoughts of suicide or self-harm, worsening mood, feelings of depression Side effects that usually do not require medical attention (report to your care team if they continue or are bothersome): Change in sex drive or performance Diarrhea Dry mouth Excessive sweating Nausea Tremors or shaking Upset stomach This list may not describe all possible side effects. Call your doctor for medical advice about side effects. You may report side effects to FDA at 1-800-FDA-1088. Where should I keep my medication? Keep out of reach of children and pets. Store at room temperature between 15 and 30 degrees C (59 and 86 degrees F). Throw away any unused medication after the expiration date. NOTE: This sheet is a summary. It may not cover all possible information. If you have questions about this medicine, talk to your doctor, pharmacist, or health care provider.  2023 Elsevier/Gold Standard (2020-12-04 00:00:00)

## 2023-03-19 DIAGNOSIS — F419 Anxiety disorder, unspecified: Secondary | ICD-10-CM

## 2023-03-20 MED ORDER — CITALOPRAM HYDROBROMIDE 20 MG PO TABS: 20.0000 mg | ORAL_TABLET | Freq: Every day | ORAL | 3 refills | Status: DC

## 2023-04-14 ENCOUNTER — Telehealth: Payer: Self-pay | Admitting: Nurse Practitioner

## 2023-04-14 NOTE — Telephone Encounter (Signed)
  Prescription Request  04/14/2023  Is this a "Controlled Substance" medicine?   Have you seen your PCP in the last 2 weeks? LOV 3/1 for chronic follow up  If YES, route message to pool  -  If NO, patient needs to be scheduled for appointment.  What is the name of the medication or equipment? lisdexamfetamine (VYVANSE) 50 MG capsule  Have you contacted your pharmacy to request a refill? Yes, they said they did not have a refill at this time    Which pharmacy would you like this sent to? [facility]    Patient notified that their request is being sent to the clinical staff for review and that they should receive a response within 2 business days.

## 2023-04-14 NOTE — Telephone Encounter (Signed)
NA/NVM, pt's refill should be on file w/ pharmacy, dated 04/12/23 to be filled.

## 2023-04-29 DIAGNOSIS — R21 Rash and other nonspecific skin eruption: Secondary | ICD-10-CM | POA: Diagnosis not present

## 2023-04-29 DIAGNOSIS — Z6820 Body mass index (BMI) 20.0-20.9, adult: Secondary | ICD-10-CM | POA: Diagnosis not present

## 2023-04-29 DIAGNOSIS — L239 Allergic contact dermatitis, unspecified cause: Secondary | ICD-10-CM | POA: Diagnosis not present

## 2023-04-29 DIAGNOSIS — R030 Elevated blood-pressure reading, without diagnosis of hypertension: Secondary | ICD-10-CM | POA: Diagnosis not present

## 2023-04-30 ENCOUNTER — Ambulatory Visit (INDEPENDENT_AMBULATORY_CARE_PROVIDER_SITE_OTHER): Payer: Medicaid Other | Admitting: Nurse Practitioner

## 2023-04-30 ENCOUNTER — Encounter: Payer: Self-pay | Admitting: Nurse Practitioner

## 2023-04-30 VITALS — BP 132/92 | HR 124 | Temp 97.8°F | Ht 63.0 in | Wt 116.0 lb

## 2023-04-30 DIAGNOSIS — L509 Urticaria, unspecified: Secondary | ICD-10-CM | POA: Diagnosis not present

## 2023-04-30 DIAGNOSIS — F900 Attention-deficit hyperactivity disorder, predominantly inattentive type: Secondary | ICD-10-CM

## 2023-04-30 NOTE — Progress Notes (Signed)
Subjective:    Patient ID: Jasmine Velazquez, female    DOB: [birth year], 22 y.o.   MRN: [id number]   Chief Complaint: Urticaria (Started Sunday evening. Pt taken benedryl and allegra hives. Hives will go away with medicine but then comes back. Pt went to urgent care last night and received a steroid shot and a prescription for prednisone but has not picked up yet. )   Patient says Monday morning she was in the shower and noticed that her feet were itching with pumps. Slwly resolved. Started to spread throughout the day. By Tuesday she was covered in hives and was itching. SHe went to urgent care and got a steroid shot. Was also prescribed oral steroids. During the night she itched all night. Took benadryl and allegra hive and rash is gone with no itching. Denies any new soaps or detergents. No new foods or drinks. Denies any recent consumption of red meat.  Urticaria This is a new problem. The current episode started in the past 7 days. The problem has been resolved since onset. The rash is diffuse. The rash is characterized by bruising. She was exposed to nothing. Pertinent negatives include no eye pain or shortness of breath. Past treatments include antihistamine. The treatment provided significant relief. There is no history of allergies, asthma, eczema or varicella.    Patient Active Problem List   Diagnosis Date Noted   Attention deficit hyperactivity disorder (ADHD), predominantly inattentive type 03/15/2021   Mouth sore 09/08/2020   Dysmenorrhea in adolescent 05/18/2019   Acne vulgaris 05/18/2019   Left shoulder pain 09/08/2015       Review of Systems  Constitutional:  Negative for diaphoresis.  Eyes:  Negative for pain.  Respiratory:  Negative for shortness of breath.   Cardiovascular:  Negative for chest pain, palpitations and leg swelling.  Gastrointestinal:  Negative for abdominal pain.  Endocrine: Negative for polydipsia.  Skin:  Negative for rash.  Neurological:   Negative for dizziness, weakness and headaches.  Hematological:  Does not bruise/bleed easily.  All other systems reviewed and are negative.      Objective:   Physical Exam Vitals and nursing note reviewed.  Constitutional:      General: She is not in acute distress.    Appearance: Normal appearance. She is well-developed.  Neck:     Vascular: No carotid bruit or JVD.  Cardiovascular:     Rate and Rhythm: Normal rate and regular rhythm.     Heart sounds: Normal heart sounds.  Pulmonary:     Effort: Pulmonary effort is normal. No respiratory distress.     Breath sounds: Normal breath sounds. No wheezing or rales.  Chest:     Chest wall: No tenderness.  Abdominal:     General: Bowel sounds are normal. There is no distension or abdominal bruit.     Palpations: Abdomen is soft. There is no hepatomegaly, splenomegaly, mass or pulsatile mass.     Tenderness: There is no abdominal tenderness.  Musculoskeletal:        General: Normal range of motion.     Cervical back: Normal range of motion and neck supple.  Lymphadenopathy:     Cervical: No cervical adenopathy.  Skin:    General: Skin is warm and dry.     Comments: Currently no hives  Neurological:     Mental Status: She is alert and oriented to person, place, and time.     Deep Tendon Reflexes: Reflexes are normal and symmetric.  Psychiatric:  Behavior: Behavior normal.        Thought Content: Thought content normal.        Judgment: Judgment normal.    BP (!) 132/92   Pulse (!) 124   Temp 97.8 F (36.6 C) (Temporal)   Ht 5\' 3"  (1.6 m)   Wt 116 lb (52.6 kg)   SpO2 100%   BMI 20.55 kg/m         Assessment & Plan:   Jasmine Velazquez in today with chief complaint of Urticaria (Started Sunday evening. Pt taken benedryl and allegra hives. Hives will go away with medicine but then comes back. Pt went to urgent care last night and received a steroid shot and a prescription for prednisone but has not picked up yet.  )   1. Urticaria Has resolved Avoid scratching Keep food journal Benadryl as needed Take steroid that was prescribed by urgent care RTO prn   The above assessment and management plan was discussed with the patient. The patient verbalized understanding of and has agreed to the management plan. Patient is aware to call the clinic if symptoms persist or worsen. Patient is aware when to return to the clinic for a follow-up visit. Patient educated on when it is appropriate to go to the emergency department.   Mary-Margaret Daphine Deutscher, FNP

## 2023-04-30 NOTE — Patient Instructions (Signed)
Hives Hives are itchy, red, swollen areas on your skin. They can show up on any part of your body. They often go away within 24 hours (acute hives). If you get new hives after the old ones fade and this goes on for many days or weeks, it is called chronic hives. Hives do not spread from person to person (are not contagious). Hives can happen when your body reacts to something that you are allergic to (allergen). These are sometimes called triggers. You can get hives right after being around a trigger, or hours later. What are the causes? Food allergies. Insect bites or stings. Allergies to pollen or pets. Spending time in sunlight, heat, or cold. Exercise. Stress. Other causes, such as: Viruses. This includes the common cold. Infections caused by germs (bacteria). Some medicines. Chemicals or latex. Allergy shots. Blood transfusions. In some cases, the cause is not known. What increases the risk? Being female. Being allergic to foods, such as: Citrus fruits. Milk. Eggs. Peanuts. Tree nuts. Shellfish. Being allergic to: Medicines. Latex. Insects. Animals. Pollen. What are the signs or symptoms?  Itchy, red or white bumps or spots on your skin. These areas may: Swell and get bigger. Change in shape and location. Stand alone or connect to each other over a large area of skin. Sting or hurt. Turn white when pressed in the center (blanch). In very bad cases, your hands, feet, and face may also swell. This may happen if hives start deeper in your skin. How is this treated? Treatment for hives depends on your symptoms. You may need to: Use cool, wet cloths (cool compresses) or take cool showers to stop the itching. Take or apply medicines to: Help with itching (antihistamines). Lessen swelling (corticosteroids). Treat infection (antibiotics). Have a medicine called omalizumab given to you as a shot. You may need this if your hives do not get better with other treatments. In  very bad cases, you may need to use a device filled with medicine that gives an emergency shot of epinephrine (auto-injector pen) to stop a very bad allergic reaction (anaphylactic reaction). Follow these instructions at home: Medicines Take or apply over-the-counter and prescription medicines only as told by your doctor. If you were prescribed antibiotics, use them as told by your doctor. Do not stop using them even if you start to feel better. Skin care Put cool, wet cloths on the hives. Do not scratch your skin. Do not rub your skin. General instructions Do not take hot showers or baths. This can make itching worse. Do not wear tight clothes. Use sunscreen. Wear clothes that cover your skin when you are outside. Avoid triggers that cause your hives. Keep a journal to help track what causes your hives. Write down: What medicines you take. What you eat and drink. What you put on your skin. Keep all follow-up visits. Your doctor will need to make sure treatment is working. Contact a doctor if: Your symptoms do not get better with medicine. Your joints hurt or swell. You have a fever. You have pain in your belly (abdomen). Get help right away if: Your tongue or lips swell. Your eyelids are swollen. Your chest or throat feels tight. You have trouble breathing or swallowing. These symptoms may be an emergency. Get help right away. Call 911. Do not wait to see if the symptoms will go away. Do not drive yourself to the hospital. This information is not intended to replace advice given to you by your health care provider. Make sure   you discuss any questions you have with your health care provider. Document Revised: 08/20/2022 Document Reviewed: 08/20/2022 Elsevier Patient Education  2023 [employer]  

## 2023-05-01 DIAGNOSIS — M79602 Pain in left arm: Secondary | ICD-10-CM | POA: Diagnosis not present

## 2023-05-01 DIAGNOSIS — T07XXXA Unspecified multiple injuries, initial encounter: Secondary | ICD-10-CM | POA: Diagnosis not present

## 2023-05-01 DIAGNOSIS — M79601 Pain in right arm: Secondary | ICD-10-CM | POA: Diagnosis not present

## 2023-05-01 DIAGNOSIS — R030 Elevated blood-pressure reading, without diagnosis of hypertension: Secondary | ICD-10-CM | POA: Diagnosis not present

## 2023-05-01 DIAGNOSIS — Z6820 Body mass index (BMI) 20.0-20.9, adult: Secondary | ICD-10-CM | POA: Diagnosis not present

## 2023-05-23 ENCOUNTER — Ambulatory Visit: Payer: Medicaid Other | Admitting: Nurse Practitioner

## 2023-05-28 ENCOUNTER — Encounter: Payer: Self-pay | Admitting: Nurse Practitioner

## 2023-06-10 DIAGNOSIS — F900 Attention-deficit hyperactivity disorder, predominantly inattentive type: Secondary | ICD-10-CM

## 2023-06-10 MED ORDER — LISDEXAMFETAMINE DIMESYLATE 50 MG PO CAPS: 50.0000 mg | ORAL_CAPSULE | Freq: Every day | ORAL | 0 refills | Status: DC

## 2023-06-10 NOTE — Telephone Encounter (Signed)
Was given one med refill to give time to make follow up appointment  Meds ordered this encounter  Medications   lisdexamfetamine (VYVANSE) 50 MG capsule    Sig: Take 1 capsule (50 mg total) by mouth daily.    Dispense:  30 capsule    Refill:  0    Order Specific Question:   Supervising Provider    Answer:   Nils Pyle [[id number]]   Mary-Margaret Daphine Deutscher, FNP

## 2023-06-23 ENCOUNTER — Ambulatory Visit: Payer: Medicaid Other | Admitting: Nurse Practitioner

## 2023-07-07 ENCOUNTER — Ambulatory Visit (INDEPENDENT_AMBULATORY_CARE_PROVIDER_SITE_OTHER): Payer: Medicaid Other | Admitting: Nurse Practitioner

## 2023-07-07 ENCOUNTER — Encounter: Payer: Self-pay | Admitting: Nurse Practitioner

## 2023-07-07 VITALS — BP 111/80 | HR 83 | Temp 97.7°F | Resp 20 | Ht 63.0 in | Wt 122.0 lb

## 2023-07-07 DIAGNOSIS — F900 Attention-deficit hyperactivity disorder, predominantly inattentive type: Secondary | ICD-10-CM

## 2023-07-07 MED ORDER — LISDEXAMFETAMINE DIMESYLATE 50 MG PO CAPS
50.0000 mg | ORAL_CAPSULE | Freq: Every day | ORAL | 0 refills | Status: DC
Start: 2023-08-06 — End: 2023-10-10

## 2023-07-07 MED ORDER — LISDEXAMFETAMINE DIMESYLATE 50 MG PO CAPS: 50.0000 mg | ORAL_CAPSULE | Freq: Every day | ORAL | 0 refills | Status: DC

## 2023-07-07 MED ORDER — LISDEXAMFETAMINE DIMESYLATE 50 MG PO CAPS
50.0000 mg | ORAL_CAPSULE | Freq: Every day | ORAL | 0 refills | Status: DC
Start: 2023-09-05 — End: 2023-10-10

## 2023-07-07 NOTE — Progress Notes (Signed)
Subjective:    Patient ID: Jasmine Velazquez, female    DOB: 03-Jan-2001, 22 y.o.   MRN: [id number]    Chief Complaint: ADHD    HPI:  Jasmine Velazquez is a 22 y.o. who identifies as a female who was assigned female at birth.   Social history: Lives with: dad Work history: nannie   Comes in today for follow up of the following chronic medical issues:  1. Attention deficit hyperactivity disorder (ADHD), predominantly inattentive type Patient is on vyvanse 50mg  daily. She has been on ADHD meds since middle school. Is not able to concentrate without meds. No medication side effects.   New complaints: None today  No Known Allergies Outpatient Encounter Medications as of 07/07/2023  Medication Sig   citalopram (CELEXA) 20 MG tablet Take 1 tablet (20 mg total) by mouth daily.   lisdexamfetamine (VYVANSE) 50 MG capsule Take 1 capsule (50 mg total) by mouth daily.   lisdexamfetamine (VYVANSE) 50 MG capsule Take 1 capsule (50 mg total) by mouth daily.   lisdexamfetamine (VYVANSE) 50 MG capsule Take 1 capsule (50 mg total) by mouth daily.   norgestimate-ethinyl estradiol (ORTHO-CYCLEN) 0.25-35 MG-MCG tablet Take 1 tablet by mouth daily. (NEEDS TO BE SEEN BEFORE NEXT REFILL) (Patient not taking: Reported on 07/07/2023)   No facility-administered encounter medications on file as of 07/07/2023.    Past Surgical History:  Procedure Laterality Date   TONSILLECTOMY AND ADENOIDECTOMY Bilateral 06/11/2019   Procedure: TONSILLECTOMY AND ADENOIDECTOMY;  Surgeon: Newman Pies, MD;  Location: [facility];  Service: ENT;  Laterality: Bilateral;    Family History  Problem Relation Age of Onset   Hypertension Mother    Liver disease Mother    Other Sister        Cyclic Vomiting Syndrome      Controlled substance contract: n/a    Review of Systems  Constitutional:  Negative for diaphoresis.  Eyes:  Negative for pain.  Respiratory:  Negative for shortness of breath.    Cardiovascular:  Negative for chest pain, palpitations and leg swelling.  Gastrointestinal:  Negative for abdominal pain.  Endocrine: Negative for polydipsia.  Skin:  Negative for rash.  Neurological:  Negative for dizziness, weakness and headaches.  Hematological:  Does not bruise/bleed easily.  All other systems reviewed and are negative.      Objective:   Physical Exam Vitals and nursing note reviewed.  Constitutional:      General: She is not in acute distress.    Appearance: Normal appearance. She is well-developed.  Neck:     Vascular: No carotid bruit or JVD.  Cardiovascular:     Rate and Rhythm: Normal rate and regular rhythm.     Heart sounds: Normal heart sounds.  Pulmonary:     Effort: Pulmonary effort is normal. No respiratory distress.     Breath sounds: Normal breath sounds. No wheezing or rales.  Chest:     Chest wall: No tenderness.  Abdominal:     General: Bowel sounds are normal. There is no distension or abdominal bruit.     Palpations: Abdomen is soft. There is no hepatomegaly, splenomegaly, mass or pulsatile mass.     Tenderness: There is no abdominal tenderness.  Musculoskeletal:        General: Normal range of motion.     Cervical back: Normal range of motion and neck supple.  Lymphadenopathy:     Cervical: No cervical adenopathy.  Skin:    General: Skin is warm and dry.  Neurological:     Mental Status: She is alert and oriented to person, place, and time.     Deep Tendon Reflexes: Reflexes are normal and symmetric.  Psychiatric:        Behavior: Behavior normal.        Thought Content: Thought content normal.        Judgment: Judgment normal.    BP 111/80   Pulse 83   Temp 97.7 F (36.5 C) (Temporal)   Resp 20   Ht 5\' 3"  (1.6 m)   Wt 122 lb (55.3 kg)   SpO2 100%   BMI 21.61 kg/m         Assessment & Plan:    Jasmine Velazquez comes in today with chief complaint of ADHD   Diagnosis and orders addressed:  1. Attention deficit  hyperactivity disorder (ADHD), predominantly inattentive type Stress management - ToxASSURE Select 13 (MW), Urine - lisdexamfetamine (VYVANSE) 50 MG capsule; Take 1 capsule (50 mg total) by mouth daily.  Dispense: 30 capsule; Refill: 0 - lisdexamfetamine (VYVANSE) 50 MG capsule; Take 1 capsule (50 mg total) by mouth daily.  Dispense: 30 capsule; Refill: 0 - lisdexamfetamine (VYVANSE) 50 MG capsule; Take 1 capsule (50 mg total) by mouth daily.  Dispense: 30 capsule; Refill: 0   Follow up plan: 3 months   Mary-Margaret Daphine Deutscher, FNP

## 2023-07-09 LAB — TOXASSURE SELECT 13 (MW), URINE

## 2023-10-10 ENCOUNTER — Encounter: Payer: Self-pay | Admitting: Nurse Practitioner

## 2023-10-10 ENCOUNTER — Ambulatory Visit (INDEPENDENT_AMBULATORY_CARE_PROVIDER_SITE_OTHER): Payer: Medicaid Other | Admitting: Nurse Practitioner

## 2023-10-10 DIAGNOSIS — Z3041 Encounter for surveillance of contraceptive pills: Secondary | ICD-10-CM

## 2023-10-10 DIAGNOSIS — F900 Attention-deficit hyperactivity disorder, predominantly inattentive type: Secondary | ICD-10-CM

## 2023-10-10 MED ORDER — LISDEXAMFETAMINE DIMESYLATE 50 MG PO CAPS
50.0000 mg | ORAL_CAPSULE | Freq: Every day | ORAL | 0 refills | Status: DC
Start: 2023-11-09 — End: 2024-01-09

## 2023-10-10 MED ORDER — LISDEXAMFETAMINE DIMESYLATE 50 MG PO CAPS
50.0000 mg | ORAL_CAPSULE | Freq: Every day | ORAL | 0 refills | Status: DC
Start: 2023-10-10 — End: 2024-01-09

## 2023-10-10 MED ORDER — NORGESTIMATE-ETH ESTRADIOL 0.25-35 MG-MCG PO TABS
1.0000 | ORAL_TABLET | Freq: Every day | ORAL | 7 refills | Status: DC
Start: 2023-10-10 — End: 2024-08-30

## 2023-10-10 MED ORDER — LISDEXAMFETAMINE DIMESYLATE 50 MG PO CAPS
50.0000 mg | ORAL_CAPSULE | Freq: Every day | ORAL | 0 refills | Status: DC
Start: 2023-12-09 — End: 2024-01-09

## 2023-10-10 NOTE — Patient Instructions (Signed)

## 2023-10-10 NOTE — Progress Notes (Signed)
Subjective:    Patient ID: Jasmine Velazquez, female    DOB: [birth year], 22 y.o.   MRN: [id number]   Chief Complaint: ADHD and Discuss birth control meds    HPI:  Jasmine Velazquez is a 22 y.o. who identifies as a female who was assigned female at birth.   Patient is here for follow up of adhd meds. She has been on vyvanse for several years. She has been doing well. No medication side effects. Is on celexa for depression and is doing well.    10/10/2023   11:58 AM 07/07/2023    8:28 AM 04/30/2023   11:15 AM  Depression screen PHQ 2/9  Decreased Interest 0 0 0  Down, Depressed, Hopeless 0 0 0  PHQ - 2 Score 0 0 0  Altered sleeping 0 0 0  Tired, decreased energy 0 0 0  Change in appetite 0 0 0  Feeling bad or failure about yourself  0 0 0  Trouble concentrating 0 0 0  Moving slowly or fidgety/restless 0 0 0  Suicidal thoughts 0 0 0  PHQ-9 Score 0 0 0  Difficult doing work/chores Not difficult at all Not difficult at all Not difficult at all      10/10/2023   11:58 AM 07/07/2023    8:28 AM 04/30/2023   11:15 AM 02/14/2023   11:57 AM  GAD 7 : Generalized Anxiety Score  Nervous, Anxious, on Edge 3 1 0 3  Control/stop worrying 3 1 0 3  Worry too much - different things 2 1 0 3  Trouble relaxing 3 1 0 3  Restless 1 1 0 3  Easily annoyed or irritable 2 1 0 3  Afraid - awful might happen 1 1 0 3  Total GAD 7 Score 15 7 0 21  Anxiety Difficulty Not difficult at all Not difficult at all Not difficult at all Not difficult at all      Comes in today for follow up of the following chronic medical issues:  There are no diagnoses linked to this encounter.  New complaints: None today  No Known Allergies Outpatient Encounter Medications as of 10/10/2023  Medication Sig   citalopram (CELEXA) 20 MG tablet Take 1 tablet (20 mg total) by mouth daily.   norgestimate-ethinyl estradiol (ORTHO-CYCLEN) 0.25-35 MG-MCG tablet Take 1 tablet by mouth daily. (NEEDS TO BE SEEN BEFORE NEXT  REFILL)   lisdexamfetamine (VYVANSE) 50 MG capsule Take 1 capsule (50 mg total) by mouth daily.   lisdexamfetamine (VYVANSE) 50 MG capsule Take 1 capsule (50 mg total) by mouth daily.   lisdexamfetamine (VYVANSE) 50 MG capsule Take 1 capsule (50 mg total) by mouth daily.   No facility-administered encounter medications on file as of 10/10/2023.    Past Surgical History:  Procedure Laterality Date   TONSILLECTOMY AND ADENOIDECTOMY Bilateral 06/11/2019   Procedure: TONSILLECTOMY AND ADENOIDECTOMY;  Surgeon: Newman Pies, MD;  Location: [facility];  Service: ENT;  Laterality: Bilateral;    Family History  Problem Relation Age of Onset   Hypertension Mother    Liver disease Mother    Other Sister        Cyclic Vomiting Syndrome      Controlled substance contract: n/a     Review of Systems  Constitutional:  Negative for diaphoresis.  Eyes:  Negative for pain.  Respiratory:  Negative for shortness of breath.   Cardiovascular:  Negative for chest pain, palpitations and leg swelling.  Gastrointestinal:  Negative for abdominal pain.  Endocrine: Negative for polydipsia.  Skin:  Negative for rash.  Neurological:  Negative for dizziness, weakness and headaches.  Hematological:  Does not bruise/bleed easily.  All other systems reviewed and are negative.      Objective:   Physical Exam Vitals and nursing note reviewed.  Constitutional:      General: She is not in acute distress.    Appearance: Normal appearance. She is well-developed.  Neck:     Vascular: No carotid bruit or JVD.  Cardiovascular:     Rate and Rhythm: Normal rate and regular rhythm.     Heart sounds: Normal heart sounds.  Pulmonary:     Effort: Pulmonary effort is normal. No respiratory distress.     Breath sounds: Normal breath sounds. No wheezing or rales.  Chest:     Chest wall: No tenderness.  Abdominal:     General: Bowel sounds are normal. There is no distension or abdominal bruit.      Palpations: Abdomen is soft. There is no hepatomegaly, splenomegaly, mass or pulsatile mass.     Tenderness: There is no abdominal tenderness.  Musculoskeletal:        General: Normal range of motion.     Cervical back: Normal range of motion and neck supple.  Lymphadenopathy:     Cervical: No cervical adenopathy.  Skin:    General: Skin is warm and dry.  Neurological:     Mental Status: She is alert and oriented to person, place, and time.     Deep Tendon Reflexes: Reflexes are normal and symmetric.  Psychiatric:        Behavior: Behavior normal.        Thought Content: Thought content normal.        Judgment: Judgment normal.    BP 122/86   Pulse 95   Temp 98.1 F (36.7 C) (Temporal)   Resp 20   Ht 5\' 3"  (1.6 m)   Wt 127 lb (57.6 kg)   SpO2 99%   BMI 22.50 kg/m         Assessment & Plan:   Jasmine Velazquez in today with chief complaint of ADHD and Discuss birth control meds   1. Encounter for surveillance of contraceptive pills - norgestimate-ethinyl estradiol (ORTHO-CYCLEN) 0.25-35 MG-MCG tablet; Take 1 tablet by mouth daily. (NEEDS TO BE SEEN BEFORE NEXT REFILL)  Dispense: 28 tablet; Refill: 7  2. Attention deficit hyperactivity disorder (ADHD), predominantly inattentive type Stress management - lisdexamfetamine (VYVANSE) 50 MG capsule; Take 1 capsule (50 mg total) by mouth daily.  Dispense: 30 capsule; Refill: 0 - lisdexamfetamine (VYVANSE) 50 MG capsule; Take 1 capsule (50 mg total) by mouth daily.  Dispense: 30 capsule; Refill: 0 - lisdexamfetamine (VYVANSE) 50 MG capsule; Take 1 capsule (50 mg total) by mouth daily.  Dispense: 30 capsule; Refill: 0    The above assessment and management plan was discussed with the patient. The patient verbalized understanding of and has agreed to the management plan. Patient is aware to call the clinic if symptoms persist or worsen. Patient is aware when to return to the clinic for a follow-up visit. Patient educated on when it  is appropriate to go to the emergency department.   Jasmine Daphine Deutscher, FNP

## 2023-10-16 ENCOUNTER — Ambulatory Visit: Payer: Medicaid Other | Admitting: Nurse Practitioner

## 2023-12-29 ENCOUNTER — Other Ambulatory Visit: Payer: Self-pay | Admitting: Nurse Practitioner

## 2023-12-29 DIAGNOSIS — F900 Attention-deficit hyperactivity disorder, predominantly inattentive type: Secondary | ICD-10-CM

## 2024-01-09 ENCOUNTER — Ambulatory Visit: Payer: Medicaid Other | Admitting: Nurse Practitioner

## 2024-01-09 ENCOUNTER — Encounter: Payer: Self-pay | Admitting: Nurse Practitioner

## 2024-01-09 VITALS — BP 122/84 | HR 93 | Temp 98.2°F | Ht 63.0 in | Wt 124.0 lb

## 2024-01-09 DIAGNOSIS — F419 Anxiety disorder, unspecified: Secondary | ICD-10-CM

## 2024-01-09 DIAGNOSIS — F900 Attention-deficit hyperactivity disorder, predominantly inattentive type: Secondary | ICD-10-CM

## 2024-01-09 MED ORDER — LISDEXAMFETAMINE DIMESYLATE 50 MG PO CAPS
50.0000 mg | ORAL_CAPSULE | Freq: Every day | ORAL | 0 refills | Status: DC
Start: 2024-01-09 — End: 2024-04-13

## 2024-01-09 MED ORDER — LISDEXAMFETAMINE DIMESYLATE 50 MG PO CAPS
50.0000 mg | ORAL_CAPSULE | Freq: Every day | ORAL | 0 refills | Status: DC
Start: 2024-02-08 — End: 2024-04-13

## 2024-01-09 MED ORDER — LISDEXAMFETAMINE DIMESYLATE 50 MG PO CAPS
50.0000 mg | ORAL_CAPSULE | Freq: Every day | ORAL | 0 refills | Status: DC
Start: 2024-03-08 — End: 2024-04-13

## 2024-01-09 MED ORDER — ESCITALOPRAM OXALATE 10 MG PO TABS: 10.0000 mg | ORAL_TABLET | Freq: Every day | ORAL | 1 refills | Status: DC

## 2024-01-09 NOTE — Progress Notes (Signed)
Subjective:    Patient ID: Jasmine Velazquez, female    DOB: 01-03-2001, 23 y.o.   MRN: [id number]    Chief Complaint: medical management of chronic issues     HPI:  Jasmine Velazquez is a 23 y.o. who identifies as a female who was assigned female at birth.   Social history: Lives with: dad Work history: nannie   Comes in today for follow up of the following chronic medical issues:  1. Attention deficit hyperactivity disorder (ADHD), predominantly inattentive type Patient is on vyvanse 50mg  daily. She has been on ADHD meds since middle school. Is not able to concentrate without meds. No medication side effects.  2  Axiety Wants to change celexa due to possible nause- she has only taken a couple of times.    01/09/2024   10:45 AM 10/10/2023   11:58 AM 07/07/2023    8:28 AM 04/30/2023   11:15 AM  GAD 7 : Generalized Anxiety Score  Nervous, Anxious, on Edge 0 3 1 0  Control/stop worrying 0 3 1 0  Worry too much - different things 0 2 1 0  Trouble relaxing 0 3 1 0  Restless 0 1 1 0  Easily annoyed or irritable 0 2 1 0  Afraid - awful might happen 0 1 1 0  Total GAD 7 Score 0 15 7 0  Anxiety Difficulty Not difficult at all Not difficult at all Not difficult at all Not difficult at all       01/09/2024   10:45 AM 10/10/2023   11:58 AM 07/07/2023    8:28 AM  Depression screen PHQ 2/9  Decreased Interest 0 0 0  Down, Depressed, Hopeless 0 0 0  PHQ - 2 Score 0 0 0  Altered sleeping 0 0 0  Tired, decreased energy 0 0 0  Change in appetite 0 0 0  Feeling bad or failure about yourself  0 0 0  Trouble concentrating 0 0 0  Moving slowly or fidgety/restless 0 0 0  Suicidal thoughts 0 0 0  PHQ-9 Score 0 0 0  Difficult doing work/chores Not difficult at all Not difficult at all Not difficult at all    New complaints: None today  No Known Allergies Outpatient Encounter Medications as of 01/09/2024  Medication Sig   citalopram (CELEXA) 20 MG tablet Take 1 tablet (20 mg total)  by mouth daily.   lisdexamfetamine (VYVANSE) 50 MG capsule Take 1 capsule (50 mg total) by mouth daily.   lisdexamfetamine (VYVANSE) 50 MG capsule Take 1 capsule (50 mg total) by mouth daily.   lisdexamfetamine (VYVANSE) 50 MG capsule Take 1 capsule (50 mg total) by mouth daily.   norgestimate-ethinyl estradiol (ORTHO-CYCLEN) 0.25-35 MG-MCG tablet Take 1 tablet by mouth daily. (NEEDS TO BE SEEN BEFORE NEXT REFILL)   No facility-administered encounter medications on file as of 01/09/2024.    Past Surgical History:  Procedure Laterality Date   TONSILLECTOMY AND ADENOIDECTOMY Bilateral 06/11/2019   Procedure: TONSILLECTOMY AND ADENOIDECTOMY;  Surgeon: Newman Pies, MD;  Location: [facility];  Service: ENT;  Laterality: Bilateral;    Family History  Problem Relation Age of Onset   Hypertension Mother    Liver disease Mother    Other Sister        Cyclic Vomiting Syndrome      Controlled substance contract: n/a    Review of Systems  Constitutional:  Negative for diaphoresis.  Eyes:  Negative for pain.  Respiratory:  Negative for shortness of  breath.   Cardiovascular:  Negative for chest pain, palpitations and leg swelling.  Gastrointestinal:  Negative for abdominal pain.  Endocrine: Negative for polydipsia.  Skin:  Negative for rash.  Neurological:  Negative for dizziness, weakness and headaches.  Hematological:  Does not bruise/bleed easily.  All other systems reviewed and are negative.      Objective:   Physical Exam Vitals and nursing note reviewed.  Constitutional:      General: She is not in acute distress.    Appearance: Normal appearance. She is well-developed.  Neck:     Vascular: No carotid bruit or JVD.  Cardiovascular:     Rate and Rhythm: Normal rate and regular rhythm.     Heart sounds: Normal heart sounds.  Pulmonary:     Effort: Pulmonary effort is normal. No respiratory distress.     Breath sounds: Normal breath sounds. No wheezing or rales.   Chest:     Chest wall: No tenderness.  Abdominal:     General: Bowel sounds are normal. There is no distension or abdominal bruit.     Palpations: Abdomen is soft. There is no hepatomegaly, splenomegaly, mass or pulsatile mass.     Tenderness: There is no abdominal tenderness.  Musculoskeletal:        General: Normal range of motion.     Cervical back: Normal range of motion and neck supple.  Lymphadenopathy:     Cervical: No cervical adenopathy.  Skin:    General: Skin is warm and dry.  Neurological:     Mental Status: She is alert and oriented to person, place, and time.     Deep Tendon Reflexes: Reflexes are normal and symmetric.  Psychiatric:        Behavior: Behavior normal.        Thought Content: Thought content normal.        Judgment: Judgment normal.    BP 122/84   Pulse 93   Temp 98.2 F (36.8 C) (Temporal)   Ht 5\' 3"  (1.6 m)   Wt 124 lb (56.2 kg)   SpO2 100%   BMI 21.97 kg/m          Assessment & Plan:    Jasmine Velazquez comes in today with chief complaint of No chief complaint on file.   Diagnosis and orders addressed:  1. Attention deficit hyperactivity disorder (ADHD), predominantly inattentive type Stress management - ToxASSURE Select 13 (MW), Urine - lisdexamfetamine (VYVANSE) 50 MG capsule; Take 1 capsule (50 mg total) by mouth daily.  Dispense: 30 capsule; Refill: 0 - lisdexamfetamine (VYVANSE) 50 MG capsule; Take 1 capsule (50 mg total) by mouth daily.  Dispense: 30 capsule; Refill: 0 - lisdexamfetamine (VYVANSE) 50 MG capsule; Take 1 capsule (50 mg total) by mouth daily.  Dispense: 30 capsule; Refill: 0  2. Gad Changed from celexa to lexapro 10mg  daily  Follow up plan: 3 months   Mary-Margaret Daphine Deutscher, FNP

## 2024-04-13 ENCOUNTER — Encounter: Payer: Self-pay | Admitting: Nurse Practitioner

## 2024-04-13 ENCOUNTER — Ambulatory Visit: Payer: Medicaid Other | Admitting: Nurse Practitioner

## 2024-04-13 VITALS — BP 125/87 | HR 110 | Temp 98.2°F | Ht 63.0 in | Wt 128.0 lb

## 2024-04-13 DIAGNOSIS — F419 Anxiety disorder, unspecified: Secondary | ICD-10-CM | POA: Diagnosis not present

## 2024-04-13 DIAGNOSIS — Z86018 Personal history of other benign neoplasm: Secondary | ICD-10-CM | POA: Diagnosis not present

## 2024-04-13 DIAGNOSIS — F900 Attention-deficit hyperactivity disorder, predominantly inattentive type: Secondary | ICD-10-CM | POA: Diagnosis not present

## 2024-04-13 MED ORDER — ESCITALOPRAM OXALATE 20 MG PO TABS: 20.0000 mg | ORAL_TABLET | Freq: Every day | ORAL | 5 refills | Status: DC

## 2024-04-13 MED ORDER — LISDEXAMFETAMINE DIMESYLATE 50 MG PO CAPS: 50.0000 mg | ORAL_CAPSULE | Freq: Every day | ORAL | 0 refills | Status: DC

## 2024-04-13 MED ORDER — LISDEXAMFETAMINE DIMESYLATE 50 MG PO CAPS
50.0000 mg | ORAL_CAPSULE | Freq: Every day | ORAL | 0 refills | Status: DC
Start: 2024-06-12 — End: 2024-07-02

## 2024-04-13 NOTE — Progress Notes (Signed)
 Subjective:    Patient ID: Jasmine Velazquez, female    DOB: [birth year], 23 y.o.   MRN: [id number]    Chief Complaint: medical management of chronic issues     HPI:  Jasmine Velazquez is a 23 y.o. who identifies as a female who was assigned female at birth.   Social history: Lives with: dad Work history: nannie   Comes in today for follow up of the following chronic medical issues:  1. Attention deficit hyperactivity disorder (ADHD), predominantly inattentive type Patient is on vyvanse  50mg  daily. She has been on ADHD meds since middle school. Is not able to concentrate without meds. No medication side effects.  2  Axiety Changed her to lexapro  at last visit. Wants to increase dose today.    04/13/2024   10:07 AM 01/09/2024   10:45 AM 10/10/2023   11:58 AM 07/07/2023    8:28 AM  GAD 7 : Generalized Anxiety Score  Nervous, Anxious, on Edge 2 0 3 1  Control/stop worrying 1 0 3 1  Worry too much - different things 1 0 2 1  Trouble relaxing 2 0 3 1  Restless 0 0 1 1  Easily annoyed or irritable 0 0 2 1  Afraid - awful might happen 0 0 1 1  Total GAD 7 Score 6 0 15 7  Anxiety Difficulty Not difficult at all Not difficult at all Not difficult at all Not difficult at all       04/13/2024   10:07 AM 01/09/2024   10:45 AM 10/10/2023   11:58 AM  Depression screen PHQ 2/9  Decreased Interest 0 0 0  Down, Depressed, Hopeless 0 0 0  PHQ - 2 Score 0 0 0  Altered sleeping  0 0  Tired, decreased energy  0 0  Change in appetite  0 0  Feeling bad or failure about yourself   0 0  Trouble concentrating  0 0  Moving slowly or fidgety/restless  0 0  Suicidal thoughts  0 0  PHQ-9 Score  0 0  Difficult doing work/chores  Not difficult at all Not difficult at all    New complaints: None today  No Known Allergies Outpatient Encounter Medications as of 04/13/2024  Medication Sig   escitalopram  (LEXAPRO ) 10 MG tablet Take 1 tablet (10 mg total) by mouth daily.   norgestimate -ethinyl  estradiol  (ORTHO-CYCLEN) 0.25-35 MG-MCG tablet Take 1 tablet by mouth daily. (NEEDS TO BE SEEN BEFORE NEXT REFILL)   lisdexamfetamine (VYVANSE ) 50 MG capsule Take 1 capsule (50 mg total) by mouth daily.   lisdexamfetamine (VYVANSE ) 50 MG capsule Take 1 capsule (50 mg total) by mouth daily.   lisdexamfetamine (VYVANSE ) 50 MG capsule Take 1 capsule (50 mg total) by mouth daily.   No facility-administered encounter medications on file as of 04/13/2024.    Past Surgical History:  Procedure Laterality Date   TONSILLECTOMY AND ADENOIDECTOMY Bilateral 06/11/2019   Procedure: TONSILLECTOMY AND ADENOIDECTOMY;  Surgeon: Reynold Caves, MD;  Location: [facility];  Service: ENT;  Laterality: Bilateral;    Family History  Problem Relation Age of Onset   Hypertension Mother    Liver disease Mother    Other Sister        Cyclic Vomiting Syndrome      Controlled substance contract: n/a    Review of Systems  Constitutional:  Negative for diaphoresis.  Eyes:  Negative for pain.  Respiratory:  Negative for shortness of breath.   Cardiovascular:  Negative for chest  pain, palpitations and leg swelling.  Gastrointestinal:  Negative for abdominal pain.  Endocrine: Negative for polydipsia.  Skin:  Negative for rash.  Neurological:  Negative for dizziness, weakness and headaches.  Hematological:  Does not bruise/bleed easily.  All other systems reviewed and are negative.      Objective:   Physical Exam Vitals and nursing note reviewed.  Constitutional:      General: She is not in acute distress.    Appearance: Normal appearance. She is well-developed.  Neck:     Vascular: No carotid bruit or JVD.  Cardiovascular:     Rate and Rhythm: Normal rate and regular rhythm.     Heart sounds: Normal heart sounds.  Pulmonary:     Effort: Pulmonary effort is normal. No respiratory distress.     Breath sounds: Normal breath sounds. No wheezing or rales.  Chest:     Chest wall: No  tenderness.  Abdominal:     General: Bowel sounds are normal. There is no distension or abdominal bruit.     Palpations: Abdomen is soft. There is no hepatomegaly, splenomegaly, mass or pulsatile mass.     Tenderness: There is no abdominal tenderness.  Musculoskeletal:        General: Normal range of motion.     Cervical back: Normal range of motion and neck supple.  Lymphadenopathy:     Cervical: No cervical adenopathy.  Skin:    General: Skin is warm and dry.  Neurological:     Mental Status: She is alert and oriented to person, place, and time.     Deep Tendon Reflexes: Reflexes are normal and symmetric.  Psychiatric:        Behavior: Behavior normal.        Thought Content: Thought content normal.        Judgment: Judgment normal.    BP 125/87   Pulse (!) 110   Temp 98.2 F (36.8 C) (Temporal)   Ht 5\' 3"  (1.6 m)   Wt 128 lb (58.1 kg)   SpO2 97%   BMI 22.67 kg/m          Assessment & Plan:    Jasmine Velazquez comes in today with chief complaint of ADHD (Wants to increase lexapro /Needs referral to gynecology for well women exam/Needs referral to derm for moles)   Diagnosis and orders addressed:  1. Attention deficit hyperactivity disorder (ADHD), predominantly inattentive type Stress management - ToxASSURE Select 13 (MW), Urine - lisdexamfetamine (VYVANSE ) 50 MG capsule; Take 1 capsule (50 mg total) by mouth daily.  Dispense: 30 capsule; Refill: 0 - lisdexamfetamine (VYVANSE ) 50 MG capsule; Take 1 capsule (50 mg total) by mouth daily.  Dispense: 30 capsule; Refill: 0 - lisdexamfetamine (VYVANSE ) 50 MG capsule; Take 1 capsule (50 mg total) by mouth daily.  Dispense: 30 capsule; Refill: 0  2. Gad Changed from celexa  to lexapro  10mg  daily  Follow up plan: 3 months   Mary-Margaret Gaylyn Keas, FNP

## 2024-04-14 DIAGNOSIS — S90562A Insect bite (nonvenomous), left ankle, initial encounter: Secondary | ICD-10-CM | POA: Diagnosis not present

## 2024-04-19 ENCOUNTER — Telehealth: Admitting: Nurse Practitioner

## 2024-04-19 DIAGNOSIS — H6501 Acute serous otitis media, right ear: Secondary | ICD-10-CM | POA: Diagnosis not present

## 2024-04-19 DIAGNOSIS — H9201 Otalgia, right ear: Secondary | ICD-10-CM | POA: Diagnosis not present

## 2024-04-19 MED ORDER — IPRATROPIUM BROMIDE 0.03 % NA SOLN: 2.0000 | Freq: Two times a day (BID) | NASAL | 12 refills | Status: AC

## 2024-04-19 NOTE — Progress Notes (Signed)

## 2024-04-21 ENCOUNTER — Ambulatory Visit: Payer: Self-pay

## 2024-04-21 DIAGNOSIS — H9201 Otalgia, right ear: Secondary | ICD-10-CM | POA: Diagnosis not present

## 2024-04-21 DIAGNOSIS — Z6823 Body mass index (BMI) 23.0-23.9, adult: Secondary | ICD-10-CM | POA: Diagnosis not present

## 2024-04-21 DIAGNOSIS — R21 Rash and other nonspecific skin eruption: Secondary | ICD-10-CM | POA: Diagnosis not present

## 2024-04-21 DIAGNOSIS — T50905A Adverse effect of unspecified drugs, medicaments and biological substances, initial encounter: Secondary | ICD-10-CM | POA: Diagnosis not present

## 2024-04-21 NOTE — Telephone Encounter (Signed)
  Chief Complaint: rash to legs Symptoms: small red dots to bilateral legs that has gotten worse today Frequency: started last night Pertinent Negatives: Patient denies fever, itching Disposition: [] ED /[x] Urgent Care (no appt availability in office) / [] Appointment(In office/virtual)/ []  [place] [facility]/ [] Home Care/ [] Refused Recommended Disposition /[] [place] [facility]/ []  Follow-up with PCP Additional Notes: patient calling to report rash to bilateral legs. Patient states she had been taking Bactrim  for cellulitis from a bug bite. Patient endorses she took the last dose of Bactrim  last night. No itching or fever. Per protocol, patient is recommended to be seen today. Patient recommended to Urgent Care. Patient verbalized understanding and all questions answered.    Copied from CRM [id number]. Topic: Clinical - Red Word Triage >> Apr 21, 2024  4:38 PM [place] wrote: Red Word that prompted transfer to Nurse Triage: swelling on right side from face to neck, in lymph nodes, rash all over body, just finished Bactrim , pain in right ear Reason for Disposition  [1] Purple or blood-colored rash (spots or dots) AND [2] no fever AND [3] sounds well to triager  Answer Assessment - Initial Assessment Questions 1. APPEARANCE of RASH: "Describe the rash." (e.g., spots, blisters, raised areas, skin peeling, scaly)     Red dots all over legs 2. SIZE: "How big are the spots?" (e.g., tip of pen, eraser, coin; inches, centimeters)     Tip of a pen 3. LOCATION: "Where is the rash located?"     Bilateral legs 4. COLOR: "What color is the rash?" (Note: It is difficult to assess rash color in people with darker-colored skin. When this situation occurs, simply ask the caller to describe what they see.)     red 5. ONSET: "When did the rash begin?"     Started yesterday and has gotten worse on both legs 6. FEVER: "Do you have a fever?" If Yes, ask: "What is your temperature, how was it measured, and  when did it start?"     unsure 7. ITCHING: "Does the rash itch?" If Yes, ask: "How bad is the itch?" (Scale 1-10; or mild, moderate, severe)     No itching 8. CAUSE: "What do you think is causing the rash?"     Possible from antibiotic 9. MEDICINE FACTORS: "Have you started any new medicines within the last 2 weeks?" (e.g., antibiotics)      Bactrim -finished today 10. OTHER SYMPTOMS: "Do you have any other symptoms?" (e.g., dizziness, headache, sore throat, joint pain)       Sore throat 11. PREGNANCY: "Is there any chance you are pregnant?" "When was your last menstrual period?"       no  Protocols used: Rash or Redness - [facility]

## 2024-04-22 ENCOUNTER — Telehealth: Payer: Self-pay

## 2024-04-22 ENCOUNTER — Other Ambulatory Visit: Payer: Self-pay

## 2024-04-22 ENCOUNTER — Ambulatory Visit: Admitting: Nurse Practitioner

## 2024-04-22 ENCOUNTER — Ambulatory Visit: Admitting: Family Medicine

## 2024-04-22 ENCOUNTER — Emergency Department (HOSPITAL_BASED_OUTPATIENT_CLINIC_OR_DEPARTMENT_OTHER)
Admission: EM | Admit: 2024-04-22 | Discharge: 2024-04-22 | Disposition: A | Discharge status: Home / Self Care | Attending: Emergency Medicine | Admitting: Emergency Medicine

## 2024-04-22 ENCOUNTER — Telehealth: Payer: Self-pay | Admitting: Nurse Practitioner

## 2024-04-22 ENCOUNTER — Encounter: Payer: Self-pay | Admitting: Family Medicine

## 2024-04-22 ENCOUNTER — Telehealth: Admitting: Family Medicine

## 2024-04-22 ENCOUNTER — Other Ambulatory Visit

## 2024-04-22 ENCOUNTER — Encounter (HOSPITAL_BASED_OUTPATIENT_CLINIC_OR_DEPARTMENT_OTHER): Payer: Self-pay

## 2024-04-22 VITALS — BP 113/65 | HR 85 | Temp 98.1°F | Ht 63.0 in | Wt 134.2 lb

## 2024-04-22 DIAGNOSIS — T378X5A Adverse effect of other specified systemic anti-infectives and antiparasitics, initial encounter: Secondary | ICD-10-CM | POA: Diagnosis not present

## 2024-04-22 DIAGNOSIS — R21 Rash and other nonspecific skin eruption: Secondary | ICD-10-CM

## 2024-04-22 DIAGNOSIS — R220 Localized swelling, mass and lump, head: Secondary | ICD-10-CM | POA: Diagnosis not present

## 2024-04-22 DIAGNOSIS — R590 Localized enlarged lymph nodes: Secondary | ICD-10-CM

## 2024-04-22 DIAGNOSIS — T3695XA Adverse effect of unspecified systemic antibiotic, initial encounter: Secondary | ICD-10-CM

## 2024-04-22 DIAGNOSIS — L299 Pruritus, unspecified: Secondary | ICD-10-CM | POA: Diagnosis not present

## 2024-04-22 DIAGNOSIS — T50905A Adverse effect of unspecified drugs, medicaments and biological substances, initial encounter: Secondary | ICD-10-CM

## 2024-04-22 DIAGNOSIS — T370X5A Adverse effect of sulfonamides, initial encounter: Secondary | ICD-10-CM | POA: Diagnosis not present

## 2024-04-22 MED ORDER — PREDNISONE 10 MG (21) PO TBPK: ORAL_TABLET | Freq: Every day | ORAL | 0 refills | Status: DC

## 2024-04-22 NOTE — ED Triage Notes (Signed)
 Patient arrives ambulatory to the ED with complaints of worsening rash/hives x3 days. Patient states that she was started on bactrim  last week for a bug bite to her left ankle. She then started developing right facial and lymph node swelling.   Negative for Mono at Urgent yesterday.

## 2024-04-22 NOTE — Progress Notes (Signed)
 Acute Office Visit  Subjective:     Patient ID: Jasmine Velazquez, female    DOB: 2001/07/08, 23 y.o.   MRN: [id number]  Chief Complaint  Patient presents with   Rash    HPI  Jasmine Velazquez is here for a rash. On 4/28 she felt a bug bite to her left lower leg above her ankle. She immediately scratched at the area but there was no insect at that point. She did not see the insect.  On 4/30 she noticed redness around the bite. She was seen at urgent care and prescribed bactrim  for cellulitis. On 5/5 she noted tender enlarged lymph notes under her jaw of the right side of her neck and ear pain. She was seen at [facility] again and given flonase . On 5/6 she noticed pinpoint red dots around the bite area. She completed bactrim  the following morning of 5/7. At this point the rash become to spread up both legs, to her torso, neck, and face. She also felt flushed, fatigued, had back pain, and sore throat. Lymph nodes of the right side of her neck remained swollen and tender. She had a negative mono test and was told that she was likely allergic to the bactrim  that she had finished and the rash would improve since she had completed the antibiotic. Overnight she developed hives. She was given prednisone  for an allergic reaction to bactrim . She took 60 mg a prednisone  when she got home. She then took a nap. When she woke up, the rash was significantly better and was resolved on her face, neck.   She had not had a temperature over 100. Lymph nodes feel smaller. No ear pain now. No cough, congestion, weakness, numbness, tingling, HA, neck pain, changes in vision, confusion, nausea, vomiting, shortness of breath, wheezing, angioedema.      04/22/2024    4:16 PM 04/13/2024   10:07 AM 01/09/2024   10:45 AM  Depression screen PHQ 2/9  Decreased Interest 0 0 0  Down, Depressed, Hopeless 0 0 0  PHQ - 2 Score 0 0 0  Altered sleeping 0  0  Tired, decreased energy 0  0  Change in appetite 0  0  Feeling bad or failure about  yourself  0  0  Trouble concentrating 0  0  Moving slowly or fidgety/restless 0  0  Suicidal thoughts 0  0  PHQ-9 Score 0  0  Difficult doing work/chores Not difficult at all  Not difficult at all      04/22/2024    4:16 PM 04/13/2024   10:07 AM 01/09/2024   10:45 AM 10/10/2023   11:58 AM  GAD 7 : Generalized Anxiety Score  Nervous, Anxious, on Edge 0 2 0 3  Control/stop worrying 0 1 0 3  Worry too much - different things 0 1 0 2  Trouble relaxing 0 2 0 3  Restless 0 0 0 1  Easily annoyed or irritable 0 0 0 2  Afraid - awful might happen 0 0 0 1  Total GAD 7 Score 0 6 0 15  Anxiety Difficulty Not difficult at all Not difficult at all Not difficult at all Not difficult at all     ROS As per HPI.      Objective:    BP 113/65   Pulse 85   Temp 98.1 F (36.7 C) (Temporal)   Ht 5\' 3"  (1.6 m)   Wt 134 lb 3.2 oz (60.9 kg)   SpO2 96%   BMI 23.77  kg/m    Physical Exam Vitals and nursing note reviewed.  Constitutional:      General: She is not in acute distress.    Appearance: She is not ill-appearing, toxic-appearing or diaphoretic.  HENT:     Head: Normocephalic and atraumatic.     Right Ear: Tympanic membrane, ear canal and external ear normal.     Left Ear: Tympanic membrane, ear canal and external ear normal.     Nose: Nose normal.     Mouth/Throat:     Mouth: Mucous membranes are moist. No angioedema.     Pharynx: Oropharynx is clear.     Tonsils: No tonsillar exudate or tonsillar abscesses. 0 on the right. 0 on the left.  Eyes:     General:        Right eye: No discharge.        Left eye: No discharge.     Conjunctiva/sclera: Conjunctivae normal.  Cardiovascular:     Rate and Rhythm: Normal rate and regular rhythm.     Heart sounds: Normal heart sounds. No murmur heard. Pulmonary:     Effort: Pulmonary effort is normal. No respiratory distress.     Breath sounds: Normal breath sounds. No wheezing, rhonchi or rales.  Musculoskeletal:     Cervical back:  Neck supple. No rigidity.     Right lower leg: No edema.     Left lower leg: No edema.  Lymphadenopathy:     Cervical: Cervical adenopathy present.     Right cervical: Superficial cervical adenopathy (single midly enlarged lymph node on right side) present.  Skin:    General: Skin is warm and dry.     Findings: Rash present. Rash is urticarial (mild to bilateral lower legs).  Neurological:     General: No focal deficit present.     Mental Status: She is alert and oriented to person, place, and time.     Motor: No weakness.     Gait: Gait normal.  Psychiatric:        Mood and Affect: Mood normal.        Behavior: Behavior normal.     No results found for any visits on 04/22/24.      Assessment & Plan:   Jasmine Velazquez was seen today for rash.  Diagnoses and all orders for this visit:  Rash  Adverse reaction to antibiotic  Cervical lymphadenopathy  Reviewed ER note, e-visit note, mychart message from patient with pictures. Rash is significant improved after 1 dose of prednisone . Discussed reaction to bactrim  and added bactrim  to patient's allergy list. Discussed that cervical lymphadenopathy is improving and likely due to viral illness given now resolved ear pain and sore throat. Complete prednisone  as prescribed. Return to office for new or worsening symptoms, or if symptoms persist.   Total time spent caring for the patient today was 39 minutes. This includes time spent before the visit reviewing the chart, time spent during the visit, and time spent after the visit on documentation   Return if symptoms worsen or fail to improve.  [name], FNP

## 2024-04-22 NOTE — ED Provider Notes (Signed)
 [place] EMERGENCY DEPARTMENT AT [facility] Provider Note   CSN: [id number] Arrival date & time: 04/22/24  1914     History  Chief Complaint  Patient presents with   Medication Reaction   Rash   Facial Swelling    Right     Jasmine Velazquez is a 23 y.o. female with overall noncontributory past medical history who presents concern for worsening rash/hives for 3 days.  She was started on Bactrim  last week for a bug bite on her left ankle.  She endorses red itchy hive-like rash throughout the body.  She reports that she feels like she is having some right facial and lymph node swelling.  She denies any sore throat, cough.   Rash      Home Medications Prior to Admission medications   Medication Sig Start Date End Date Taking? Authorizing Provider  predniSONE  (STERAPRED UNI-PAK 21 TAB) 10 MG (21) TBPK tablet Take by mouth daily. Take 6 tabs by mouth daily  for 2 days, then 5 tabs for 2 days, then 4 tabs for 2 days, then 3 tabs for 2 days, 2 tabs for 2 days, then 1 tab by mouth daily for 2 days 04/22/24  Yes Ayven Pheasant H, PA-C  escitalopram  (LEXAPRO ) 20 MG tablet Take 1 tablet (20 mg total) by mouth daily. 04/13/24   Gaylyn Keas, Mary-Margaret, FNP  ipratropium (ATROVENT) 0.03 % nasal spray Place 2 sprays into both nostrils every 12 (twelve) hours. 04/19/24   Mardene Shake, FNP  lisdexamfetamine (VYVANSE ) 50 MG capsule Take 1 capsule (50 mg total) by mouth daily. 04/13/24 05/13/24  Delfina Feller, FNP  lisdexamfetamine (VYVANSE ) 50 MG capsule Take 1 capsule (50 mg total) by mouth daily. 05/13/24 06/12/24  Delfina Feller, FNP  lisdexamfetamine (VYVANSE ) 50 MG capsule Take 1 capsule (50 mg total) by mouth daily. 06/12/24 07/12/24  Delfina Feller, FNP  norgestimate -ethinyl estradiol  (ORTHO-CYCLEN) 0.25-35 MG-MCG tablet Take 1 tablet by mouth daily. (NEEDS TO BE SEEN BEFORE NEXT REFILL) 10/10/23   Delfina Feller, FNP      Allergies    Bactrim   [sulfamethoxazole -trimethoprim ]    Review of Systems   Review of Systems  Skin:  Positive for rash.  All other systems reviewed and are negative.   Physical Exam Updated Vital Signs BP 123/79 (BP Location: Right Arm)   Pulse 87   Temp 99.2 F (37.3 C) (Oral)   Resp 20   Ht 5\' 3"  (1.6 m)   Wt 59 kg   SpO2 100%   BMI 23.03 kg/m  Physical Exam Vitals and nursing note reviewed.  Constitutional:      General: She is not in acute distress.    Appearance: Normal appearance.  HENT:     Head: Normocephalic and atraumatic.     Comments: I do not appreciate any right sided base swelling or significant lymphadenopathy on the right. Eyes:     General:        Right eye: No discharge.        Left eye: No discharge.  Cardiovascular:     Rate and Rhythm: Normal rate and regular rhythm.     Heart sounds: No murmur heard.    No friction rub. No gallop.  Pulmonary:     Effort: Pulmonary effort is normal.     Breath sounds: Normal breath sounds.  Abdominal:     General: Bowel sounds are normal.     Palpations: Abdomen is soft.  Skin:    General: Skin is warm and dry.  Capillary Refill: Capillary refill takes less than 2 seconds.     Comments: Diffuse hive-like rash without any signs of skin sloughing, targetoid lesions.  Neurological:     Mental Status: She is alert and oriented to person, place, and time.  Psychiatric:        Mood and Affect: Mood normal.        Behavior: Behavior normal.     ED Results / Procedures / Treatments   Labs (all labs ordered are listed, but only abnormal results are displayed) Labs Reviewed - No data to display  EKG None  Radiology No results found.  Procedures Procedures    Medications Ordered in ED Medications - No data to display  ED Course/ Medical Decision Making/ A&P                                 Medical Decision Making This is an overall well-appearing 23 year old female who presents concern for medication reaction,  rash, itching.  She denies any wheezing, shortness of breath, history of anaphylaxis, nausea, vomiting, chest pain, sense of impending doom.  My emergent differential diagnosis includes anaphylaxis, medication or id reaction, versus SJS, TE N, versus other.  On physical exam high clinical suspicion for allergic medication reaction with diffuse hive-like rash, no signs of sloughing lesions, no Nikolsky sign, no targetoid lesions.  Patient reassured does not appear to be a life-threatening rash, will treat with steroids, Benadryl, encourage close follow-up with PCP. Final Clinical Impression(s) / ED Diagnoses Final diagnoses:  Adverse effect of drug, initial encounter    Rx / DC Orders ED Discharge Orders          Ordered    predniSONE  (STERAPRED UNI-PAK 21 TAB) 10 MG (21) TBPK tablet  Daily        04/22/24 1014              Treanna Dumler, [place] H, PA-C 04/22/24 1017    Hershel Los, MD 04/22/24 1037

## 2024-04-22 NOTE — Telephone Encounter (Signed)
 Copied from CRM [id number]. Topic: Clinical - Request for Lab/Test Order >> Apr 22, 2024 11:33 AM Jasmine Velazquez wrote: Reason for CRM: Patient is calling in requesting lab orders to rule out a tick bite.

## 2024-04-22 NOTE — Telephone Encounter (Signed)
 That is fine

## 2024-04-22 NOTE — ED Notes (Signed)
 Patient given discharge instructions. Questions were answered. Patient verbalized understanding of discharge instructions and care at home.

## 2024-04-22 NOTE — Telephone Encounter (Signed)
 Please review and advise.

## 2024-04-22 NOTE — Telephone Encounter (Signed)
 Unable to switch due to office controlled substance policy.

## 2024-04-22 NOTE — Discharge Instructions (Addendum)
 You can use Benadryl up to every 6 hours along with taking the entire course of steroids that are prescribed.  Please follow-up with your primary care doctor, return to the emergency department if you have significant worsening of the rash including skin peeling off, develop target shaped rash lesions, develop difficulty breathing or severe nausea, vomiting.

## 2024-04-22 NOTE — Telephone Encounter (Signed)
 Called and spoke with pt to get more information on tick bite   Tick bite was on 04/28 pt states she was put on a antibiotic for the bite , she did finish the medication but states now she has hives around the bite and all over her body , face is swollen , swollen lymph nodes , face pain. States the ED did not give her much information or tell her what she needed to do , they placed her on a prednisone  that she started today. Pt requested to be seen , only option we had was [facility] for a mychart visit , pt is sending in a picture of the bite / hives through mychart

## 2024-04-22 NOTE — Telephone Encounter (Signed)
 Pt would like to switch providers.  From [facility] to [facility].  Are both providers okay with this?

## 2024-04-23 NOTE — Telephone Encounter (Signed)
 PATIENT AWARE

## 2024-06-04 ENCOUNTER — Ambulatory Visit: Admitting: Nurse Practitioner

## 2024-06-04 ENCOUNTER — Encounter: Payer: Self-pay | Admitting: Nurse Practitioner

## 2024-06-04 VITALS — BP 126/86 | HR 95 | Temp 96.8°F | Ht 63.0 in | Wt 143.0 lb

## 2024-06-04 DIAGNOSIS — L247 Irritant contact dermatitis due to plants, except food: Secondary | ICD-10-CM | POA: Diagnosis not present

## 2024-06-04 MED ORDER — DESOXIMETASONE 0.25 % EX CREA: 1.0000 | TOPICAL_CREAM | Freq: Two times a day (BID) | CUTANEOUS | 0 refills | Status: AC

## 2024-06-04 MED ORDER — METHYLPREDNISOLONE ACETATE 80 MG/ML IJ SUSP
80.0000 mg | Freq: Once | INTRAMUSCULAR | Status: AC
  Administered 2024-06-04: 80 mg via INTRAMUSCULAR

## 2024-06-04 MED ORDER — PREDNISONE 20 MG PO TABS: 40.0000 mg | ORAL_TABLET | Freq: Every day | ORAL | 0 refills | Status: AC

## 2024-06-04 NOTE — Progress Notes (Signed)
   Subjective:    Patient ID: Jasmine Velazquez, female    DOB: 2001-04-10, 23 y.o.   MRN: [id number]   Chief Complaint: Poison Oak   HPI  Patient has been doing yard work and got into some poison ivy. She has rash all over face,  bil shin arras and on bil arms. Very itchy. She has been taking benadryl.  Patient Active Problem List   Diagnosis Date Noted   Attention deficit hyperactivity disorder (ADHD), predominantly inattentive type 03/15/2021   Dysmenorrhea in adolescent 05/18/2019   Acne vulgaris 05/18/2019       Review of Systems  Constitutional:  Negative for diaphoresis.  Eyes:  Negative for pain.  Respiratory:  Negative for shortness of breath.   Cardiovascular:  Negative for chest pain, palpitations and leg swelling.  Gastrointestinal:  Negative for abdominal pain.  Endocrine: Negative for polydipsia.  Skin:  Negative for rash.  Neurological:  Negative for dizziness, weakness and headaches.  Hematological:  Does not bruise/bleed easily.  All other systems reviewed and are negative.      Objective:   Physical Exam Constitutional:      Appearance: Normal appearance.   Cardiovascular:     Rate and Rhythm: Normal rate and regular rhythm.     Heart sounds: Normal heart sounds.  Pulmonary:     Breath sounds: Normal breath sounds.   Skin:    Comments: Vesicular pathy linear lesions on face bil shins and forearms.    Neurological:     General: No focal deficit present.     Mental Status: She is alert.    BP 126/86   Pulse 95   Temp (!) 96.8 F (36 C) (Temporal)   Ht 5' 3 (1.6 m)   Wt 143 lb (64.9 kg)   SpO2 98%   BMI 25.33 kg/m         Assessment & Plan:   Jasmine Velazquez in today with chief complaint of Poison Oak   1. Irritant contact dermatitis due to plants, except food (Primary) Avoid scratching Benadryl OTC Cool compresses RTO prn - methylPREDNISolone acetate (DEPO-MEDROL) injection 80 mg - predniSONE  (DELTASONE ) 20 MG tablet; Take 2  tablets (40 mg total) by mouth daily with breakfast for 5 days. 2 po daily for 5 days  Dispense: 10 tablet; Refill: 0 - desoximetasone (TOPICORT) 0.25 % cream; Apply 1 Application topically 2 (two) times daily.  Dispense: 30 g; Refill: 0    The above assessment and management plan was discussed with the patient. The patient verbalized understanding of and has agreed to the management plan. Patient is aware to call the clinic if symptoms persist or worsen. Patient is aware when to return to the clinic for a follow-up visit. Patient educated on when it is appropriate to go to the emergency department.   Mary-Margaret Gaylyn Keas, FNP

## 2024-06-07 ENCOUNTER — Telehealth: Payer: Self-pay | Admitting: Pharmacy Technician

## 2024-06-07 ENCOUNTER — Other Ambulatory Visit (HOSPITAL_COMMUNITY): Payer: Self-pay

## 2024-06-07 NOTE — Telephone Encounter (Signed)
 Pharmacy Patient Advocate Encounter   Received notification from Onbase that prior authorization for Desoximetasone  0.25% cream is required/requested.   Insurance verification completed.   The patient is insured through [facility] .   Per test claim:  SEE BELOW is preferred by the insurance.  If suggested medication is appropriate, Please send in a new RX and discontinue this one. If not, please advise as to why it's not appropriate so that we may request a Prior Authorization. Please note, some preferred medications may still require a PA.  If the suggested medications have not been trialed and there are no contraindications to their use, the PA will not be submitted, as it will not be approved.  CMM KEY# [id number]

## 2024-06-08 NOTE — Telephone Encounter (Signed)
 Please find out if patient still needs cream- if rash is better does not need. I do not know why insurance will not pay for cream, Crazy!

## 2024-06-08 NOTE — Telephone Encounter (Signed)
 Patient states the rash is better and she already got the cream.  It was $9 self pay.

## 2024-07-02 ENCOUNTER — Encounter: Payer: Self-pay | Admitting: Nurse Practitioner

## 2024-07-02 ENCOUNTER — Ambulatory Visit: Admitting: Nurse Practitioner

## 2024-07-02 VITALS — BP 129/89 | HR 91 | Temp 96.7°F | Ht 63.0 in | Wt 148.0 lb

## 2024-07-02 DIAGNOSIS — F900 Attention-deficit hyperactivity disorder, predominantly inattentive type: Secondary | ICD-10-CM

## 2024-07-02 DIAGNOSIS — F419 Anxiety disorder, unspecified: Secondary | ICD-10-CM | POA: Diagnosis not present

## 2024-07-02 MED ORDER — LISDEXAMFETAMINE DIMESYLATE 50 MG PO CAPS: 50.0000 mg | ORAL_CAPSULE | Freq: Every day | ORAL | 0 refills | Status: DC

## 2024-07-02 MED ORDER — ESCITALOPRAM OXALATE 20 MG PO TABS: 20.0000 mg | ORAL_TABLET | Freq: Every day | ORAL | 5 refills | Status: DC

## 2024-07-02 NOTE — Patient Instructions (Signed)

## 2024-07-02 NOTE — Progress Notes (Signed)
 Subjective:    Patient ID: Jasmine Velazquez, female    DOB: Jan 27, 2001, 23 y.o.   MRN: [id number]    Chief Complaint: medical management of chronic issues     HPI:  Jasmine Velazquez is a 23 y.o. who identifies as a female who was assigned female at birth.   Social history: Lives with: dad Work history: nannie   Comes in today for follow up of the following chronic medical issues:  1. Attention deficit hyperactivity disorder (ADHD), predominantly inattentive type Patient is on vyvanse  50mg  daily. She has been on ADHD meds since middle school. Is not able to concentrate without meds. No medication side effects.  2  Axiety Is on lexapro  20mg  daily.     07/02/2024   10:36 AM 06/04/2024    8:52 AM 04/22/2024    4:16 PM 04/13/2024   10:07 AM  GAD 7 : Generalized Anxiety Score  Nervous, Anxious, on Edge 0 0 0 2  Control/stop worrying 0 0 0 1  Worry too much - different things 0 0 0 1  Trouble relaxing 0 0 0 2  Restless 0 0 0 0  Easily annoyed or irritable 0 0 0 0  Afraid - awful might happen 0 0 0 0  Total GAD 7 Score 0 0 0 6  Anxiety Difficulty Not difficult at all Not difficult at all Not difficult at all Not difficult at all       07/02/2024   10:36 AM 06/04/2024    8:52 AM 04/22/2024    4:16 PM  Depression screen PHQ 2/9  Decreased Interest 0 0 0  Down, Depressed, Hopeless 0 0 0  PHQ - 2 Score 0 0 0  Altered sleeping   0  Tired, decreased energy   0  Change in appetite   0  Feeling bad or failure about yourself    0  Trouble concentrating   0  Moving slowly or fidgety/restless   0  Suicidal thoughts   0  PHQ-9 Score   0  Difficult doing work/chores   Not difficult at all      New complaints: None today  Allergies  Allergen Reactions   Bactrim  [Sulfamethoxazole -Trimethoprim ] Rash   Outpatient Encounter Medications as of 07/02/2024  Medication Sig   desoximetasone  (TOPICORT ) 0.25 % cream Apply 1 Application topically 2 (two) times daily.   escitalopram   (LEXAPRO ) 20 MG tablet Take 1 tablet (20 mg total) by mouth daily.   ipratropium (ATROVENT ) 0.03 % nasal spray Place 2 sprays into both nostrils every 12 (twelve) hours.   lisdexamfetamine (VYVANSE ) 50 MG capsule Take 1 capsule (50 mg total) by mouth daily.   lisdexamfetamine (VYVANSE ) 50 MG capsule Take 1 capsule (50 mg total) by mouth daily.   lisdexamfetamine (VYVANSE ) 50 MG capsule Take 1 capsule (50 mg total) by mouth daily.   norgestimate -ethinyl estradiol  (ORTHO-CYCLEN) 0.25-35 MG-MCG tablet Take 1 tablet by mouth daily. (NEEDS TO BE SEEN BEFORE NEXT REFILL)   predniSONE  (STERAPRED UNI-PAK 21 TAB) 10 MG (21) TBPK tablet Take by mouth daily. Take 6 tabs by mouth daily  for 2 days, then 5 tabs for 2 days, then 4 tabs for 2 days, then 3 tabs for 2 days, 2 tabs for 2 days, then 1 tab by mouth daily for 2 days   No facility-administered encounter medications on file as of 07/02/2024.    Past Surgical History:  Procedure Laterality Date   TONSILLECTOMY AND ADENOIDECTOMY Bilateral 06/11/2019   Procedure: TONSILLECTOMY AND ADENOIDECTOMY;  Surgeon: Karis Clunes, MD;  Location: [facility];  Service: ENT;  Laterality: Bilateral;    Family History  Problem Relation Age of Onset   Hypertension Mother    Liver disease Mother    Other Sister        Cyclic Vomiting Syndrome      Controlled substance contract: n/a    Review of Systems  Constitutional:  Negative for diaphoresis.  Eyes:  Negative for pain.  Respiratory:  Negative for shortness of breath.   Cardiovascular:  Negative for chest pain, palpitations and leg swelling.  Gastrointestinal:  Negative for abdominal pain.  Endocrine: Negative for polydipsia.  Skin:  Negative for rash.  Neurological:  Negative for dizziness, weakness and headaches.  Hematological:  Does not bruise/bleed easily.  All other systems reviewed and are negative.      Objective:   Physical Exam Vitals and nursing note reviewed.   Constitutional:      General: She is not in acute distress.    Appearance: Normal appearance. She is well-developed.  Neck:     Vascular: No carotid bruit or JVD.  Cardiovascular:     Rate and Rhythm: Normal rate and regular rhythm.     Heart sounds: Normal heart sounds.  Pulmonary:     Effort: Pulmonary effort is normal. No respiratory distress.     Breath sounds: Normal breath sounds. No wheezing or rales.  Chest:     Chest wall: No tenderness.  Abdominal:     General: Bowel sounds are normal. There is no distension or abdominal bruit.     Palpations: Abdomen is soft. There is no hepatomegaly, splenomegaly, mass or pulsatile mass.     Tenderness: There is no abdominal tenderness.  Musculoskeletal:        General: Normal range of motion.     Cervical back: Normal range of motion and neck supple.  Lymphadenopathy:     Cervical: No cervical adenopathy.  Skin:    General: Skin is warm and dry.  Neurological:     Mental Status: She is alert and oriented to person, place, and time.     Deep Tendon Reflexes: Reflexes are normal and symmetric.  Psychiatric:        Behavior: Behavior normal.        Thought Content: Thought content normal.        Judgment: Judgment normal.    BP 129/89   Pulse 91   Temp (!) 96.7 F (35.9 C) (Temporal)   Ht 5' 3 (1.6 m)   Wt 148 lb (67.1 kg)   SpO2 95%   BMI 26.22 kg/m       Assessment & Plan:    [name] comes in today with chief complaint of No chief complaint on file.   Diagnosis and orders addressed:  1. Attention deficit hyperactivity disorder (ADHD), predominantly inattentive type Stress management - ToxASSURE Select 13 (MW), Urine - lisdexamfetamine (VYVANSE ) 50 MG capsule; Take 1 capsule (50 mg total) by mouth daily.  Dispense: 30 capsule; Refill: 0 - lisdexamfetamine (VYVANSE ) 50 MG capsule; Take 1 capsule (50 mg total) by mouth daily.  Dispense: 30 capsule; Refill: 0 - lisdexamfetamine (VYVANSE ) 50 MG capsule; Take  1 capsule (50 mg total) by mouth daily.  Dispense: 30 capsule; Refill: 0  2. Gad Stress management Continue lexapro  20mg  daily  Follow up plan: 3 months   Mary-Margaret Gladis, FNP

## 2024-08-25 ENCOUNTER — Ambulatory Visit (INDEPENDENT_AMBULATORY_CARE_PROVIDER_SITE_OTHER): Admitting: Dermatology

## 2024-08-25 ENCOUNTER — Encounter: Payer: Self-pay | Admitting: Dermatology

## 2024-08-25 VITALS — BP 125/80 | HR 99

## 2024-08-25 DIAGNOSIS — D2261 Melanocytic nevi of right upper limb, including shoulder: Secondary | ICD-10-CM

## 2024-08-25 DIAGNOSIS — D2271 Melanocytic nevi of right lower limb, including hip: Secondary | ICD-10-CM | POA: Diagnosis not present

## 2024-08-25 DIAGNOSIS — D229 Melanocytic nevi, unspecified: Secondary | ICD-10-CM

## 2024-08-25 DIAGNOSIS — D225 Melanocytic nevi of trunk: Secondary | ICD-10-CM

## 2024-08-25 NOTE — Patient Instructions (Signed)

## 2024-08-25 NOTE — Progress Notes (Unsigned)
   New Patient Visit   Subjective  Jasmine Velazquez is a 23 y.o. female who presents for the following: growths Pt has growths on right foot, back and chest she'd like evaluated  No hx of skin cancer nor family hx  The following portions of the chart were reviewed this encounter and updated as appropriate: medications, allergies, medical history  Review of Systems:  No other skin or systemic complaints except as noted in HPI or Assessment and Plan.  Objective  Well appearing patient in no apparent distress; mood and affect are within normal limits.  A focused examination was performed of the following areas: Back, chest and right foot  Relevant exam findings are noted in the Assessment and Plan.    Assessment & Plan   MELANOCYTIC NEVI right 2nd toe, left chest, right shoulder and right upper back Exam: Tan-brown and/or pink-flesh-colored symmetric macules and papules  Treatment Plan: Benign appearing on exam today. Recommend observation. Call clinic for new or changing moles. Recommend daily use of broad spectrum spf 30+ sunscreen to sun-exposed areas.    Return if symptoms worsen or fail to improve.  I, Darice Smock, CMA, am acting as scribe for RUFUS CHRISTELLA HOLY, MD.   Documentation: I have reviewed the above documentation for accuracy and completeness, and I agree with the above.  RUFUS CHRISTELLA HOLY, MD

## 2024-08-30 ENCOUNTER — Other Ambulatory Visit: Payer: Self-pay | Admitting: Nurse Practitioner

## 2024-08-30 DIAGNOSIS — Z3041 Encounter for surveillance of contraceptive pills: Secondary | ICD-10-CM

## 2024-09-06 ENCOUNTER — Other Ambulatory Visit: Payer: Self-pay | Admitting: *Deleted

## 2024-09-06 DIAGNOSIS — F419 Anxiety disorder, unspecified: Secondary | ICD-10-CM

## 2024-09-06 MED ORDER — ESCITALOPRAM OXALATE 20 MG PO TABS: 20.0000 mg | ORAL_TABLET | Freq: Every day | ORAL | 0 refills | Status: DC

## 2024-09-21 ENCOUNTER — Encounter: Payer: Self-pay | Admitting: Family Medicine

## 2024-09-21 ENCOUNTER — Ambulatory Visit: Admitting: Family Medicine

## 2024-09-21 VITALS — BP 114/77 | HR 88 | Temp 98.5°F | Ht 63.0 in | Wt 156.8 lb

## 2024-09-21 DIAGNOSIS — L237 Allergic contact dermatitis due to plants, except food: Secondary | ICD-10-CM | POA: Diagnosis not present

## 2024-09-21 MED ORDER — BETAMETHASONE SOD PHOS & ACET 6 (3-3) MG/ML IJ SUSP
6.0000 mg | Freq: Once | INTRAMUSCULAR | Status: AC
  Administered 2024-09-21: 6 mg via INTRAMUSCULAR

## 2024-09-21 NOTE — Progress Notes (Signed)
 Chief Complaint  Patient presents with   Poison Ivy     X 3 days    HPI  Patient presents today for Discussed the use of AI scribe software for clinical note transcription with the patient, who gave verbal consent to proceed.  History of Present Illness Jasmine Velazquez is a 23 year old female who presents with a rash suspected to be poison ivy exposure.  The rash began on her left shoulder and has spread to her right and left forearms, as well as a blister on her left calf. It is itchy and started on Sunday afternoon, initially mistaken for mosquito bites before being identified as a poison ivy rash.  She recalls pulling up weeds in an area with poison oak, suspecting accidental contact despite her belief that she did not touch it. The exposure likely occurred on Saturday, with symptoms appearing the following day.  In June, she experienced a severe case of poison ivy, for which she received a shot and was prescribed prednisone  and a topical cream, desoximetasone . She has been applying the cream to the current rash and has also used Benadryl cream to relieve itching.     PMH: Smoking status noted Review of Systems  Objective: BP 114/77   Pulse 88   Temp 98.5 F (36.9 C)   Ht 5' 3 (1.6 m)   Wt 156 lb 12.8 oz (71.1 kg)   SpO2 96%   BMI 27.78 kg/m  Gen: NAD, alert, cooperative with exam HEENT: NCAT, EOMI, PERRL CV: RRR, good S1/S2, no murmur Resp: CTABL, no wheezes, non-labored Ext: No edema, warm Neuro: Alert and oriented, No gross deficits Skin: multiple small patches of papulovesicular eruption on all 4 ext.   Poison ivy dermatitis -     Betamethasone Sod Phos & Acet    Assessment and Plan Assessment & Plan Allergic contact dermatitis due to poison ivy/oak   Allergic contact dermatitis presents with a vesicular rash on the left shoulder, both forearms, and left calf. Rash began three days ago following exposure to poison ivy/oak while pulling weeds. Itching is  present. This episode is less severe than the previous one in June, which required corticosteroid treatment. Administer a corticosteroid injection to relieve symptoms. Advise using Benadryl cream for itching relief. Continue desoximetasone  cream if it provides relief, though it is not essential after the injection. Prednisone  is not necessary for this episode.      Butler Der, MD

## 2024-09-21 NOTE — Telephone Encounter (Signed)
 Appt scheduled 10/7 - 11:20a (pt. Aware)

## 2024-09-27 ENCOUNTER — Ambulatory Visit: Admitting: Nurse Practitioner

## 2024-09-27 ENCOUNTER — Encounter: Payer: Self-pay | Admitting: Nurse Practitioner

## 2024-09-27 VITALS — BP 127/87 | HR 114 | Temp 97.7°F | Ht 63.0 in | Wt 155.0 lb

## 2024-09-27 DIAGNOSIS — F900 Attention-deficit hyperactivity disorder, predominantly inattentive type: Secondary | ICD-10-CM | POA: Diagnosis not present

## 2024-09-27 DIAGNOSIS — L247 Irritant contact dermatitis due to plants, except food: Secondary | ICD-10-CM | POA: Diagnosis not present

## 2024-09-27 DIAGNOSIS — F419 Anxiety disorder, unspecified: Secondary | ICD-10-CM

## 2024-09-27 MED ORDER — LISDEXAMFETAMINE DIMESYLATE 50 MG PO CAPS: 50.0000 mg | ORAL_CAPSULE | Freq: Every day | ORAL | 0 refills | Status: DC

## 2024-09-27 MED ORDER — ESCITALOPRAM OXALATE 20 MG PO TABS: 20.0000 mg | ORAL_TABLET | Freq: Every day | ORAL | 1 refills | Status: AC

## 2024-09-27 MED ORDER — METHYLPREDNISOLONE ACETATE 80 MG/ML IJ SUSP
80.0000 mg | Freq: Once | INTRAMUSCULAR | Status: AC
  Administered 2024-09-27: 80 mg via INTRAMUSCULAR

## 2024-09-27 NOTE — Patient Instructions (Signed)
Poison Oak Dermatitis  Poison oak dermatitis is irritation and swelling (inflammation) of the skin. This is caused by chemicals in the leaves of the poison oak plant. You may have very bad itching, swelling, redness, a rash, and blisters. What are the causes? You may get this condition by: Touching a poison oak plant. Touching something that has the chemical from the leaves on it. This may include animals or objects that have come in contact with the plant. What increases the risk? You are more likely to get this condition if you: Go outdoors often in wooded or marshy areas. Go outdoors without wearing protective clothing, such as closed shoes, long pants, and a long-sleeved shirt. What are the signs or symptoms? Symptoms of this condition include: Redness of the skin. Very bad itching. A rash that often includes bumps and blisters. The rash usually appears 48 hours after exposure if you have been exposed before. If this is the first time you have been exposed, the rash may not appear until a week after exposure. Swelling. This may occur if the reaction is very bad. Symptoms often clear up in 1-2 weeks. The first time you get this condition, symptoms may last 3-4 weeks. How is this treated? This condition may be treated with: Hydrocortisone creams or calamine lotions to help with itching. Oatmeal baths to soothe the skin. Medicines to help reduce itching (antihistamines). If you have a very bad reaction, you may also be given steroid medicines. Follow these instructions at home: Medicines Take or apply over-the-counter and prescription medicines only as told by your doctor. Use hydrocortisone creams or calamine lotion as needed to help with itching. General instructions Do not scratch or rub your skin. Put a cold, wet cloth (cold compress) on the affected areas or take baths in cool water. This will help with itching. Avoid hot baths and showers. Take oatmeal baths as needed. Use  colloidal oatmeal. You can get this at a pharmacy or grocery store. Follow the instructions on the package. While you have the rash, wash your clothes right after you wear them. Check the affected area every day for signs of infection. Check for: More redness, swelling, or pain. Fluid or blood. Warmth. Pus or a bad smell. Keep all follow-up visits. Your doctor may want to see how your skin is progressing with treatment. How is this prevented?  Know what poison oak looks like so you can avoid it. This plant has three leaves with flowering branches on a single stem. The leaves are fuzzy. The edges of the leaves look like teeth. If you have touched poison oak, wash your skin with soap and water right away. Be sure to wash under your fingernails. When hiking or camping, wear long pants, a long-sleeved shirt, long socks, and hiking boots. You can also use a lotion on your skin that helps prevent contact with the chemical on the plant. If you think that your clothes or outdoor gear came in contact with poison oak, rinse them off with a garden hose before you bring them inside your house. When doing yard work or gardening, wear gloves, long sleeves, long pants, and boots. Wash your garden tools and gloves if they come in contact with poison oak. If you think that your pet has come into contact with poison oak, wash them with pet shampoo and water. Make sure you wear gloves while washing your pet. Do not burn poison oak plants. This can release the chemical from the plant into the air and  may cause a reaction. Contact a doctor if: You have open sores in the rash area. You have any signs of infection. You have redness that spreads beyond the rash area. You have a fever. You have a rash over a large area of your body. You have a rash on your eyes, mouth, or genitals. Your rash does not improve after a few weeks. Get help right away if: Your face swells or your eyes swell shut. You have trouble  breathing. You have trouble swallowing. These symptoms may be an emergency. Do not wait to see if the symptoms will go away. Get help right away. Call 911. This information is not intended to replace advice given to you by your health care provider. Make sure you discuss any questions you have with your health care provider. Document Revised: 05/02/2022 Document Reviewed: 05/02/2022 Elsevier Patient Education  2024 [employer].

## 2024-09-27 NOTE — Progress Notes (Signed)
 Subjective:    Patient ID: Jasmine Velazquez, female    DOB: [birth year], 23 y.o.   MRN: [id number]    Chief Complaint: medical management of chronic issues     HPI:  Jasmine Velazquez is a 22 y.o. who identifies as a female who was assigned female at birth.   Social history: Lives with: dad Work history: [employer]   Comes in today for follow up of the following chronic medical issues:  1. Attention deficit hyperactivity disorder (ADHD), predominantly inattentive type Patient is on vyvanse  50mg  daily. She has been on ADHD meds since middle school. Is not able to concentrate without meds. No medication side effects.  2  Axiety Is on lexapro  20mg  daily.     09/27/2024   11:43 AM 09/21/2024   11:51 AM 07/02/2024   10:36 AM 06/04/2024    8:52 AM  GAD 7 : Generalized Anxiety Score  Nervous, Anxious, on Edge 0 0 0 0  Control/stop worrying 0 0 0 0  Worry too much - different things 0 0 0 0  Trouble relaxing 0 0 0 0  Restless 0 0 0 0  Easily annoyed or irritable 0 0 0 0  Afraid - awful might happen 0 0 0 0  Total GAD 7 Score 0 0 0 0  Anxiety Difficulty Not difficult at all  Not difficult at all Not difficult at all       09/27/2024   11:43 AM 09/21/2024   11:51 AM 07/02/2024   10:36 AM  Depression screen PHQ 2/9  Decreased Interest 0 0 0  Down, Depressed, Hopeless 0 0 0  PHQ - 2 Score 0 0 0      New complaints: Was seen last week with poison oak. Had depomedrl injection last week and is still on arms and is spreading. Vey itchy.  Allergies  Allergen Reactions   Bactrim  [Sulfamethoxazole -Trimethoprim ] Rash   Outpatient Encounter Medications as of 09/27/2024  Medication Sig   desoximetasone  (TOPICORT ) 0.25 % cream Apply 1 Application topically 2 (two) times daily.   escitalopram  (LEXAPRO ) 20 MG tablet Take 1 tablet (20 mg total) by mouth daily.   ipratropium (ATROVENT ) 0.03 % nasal spray Place 2 sprays into both nostrils every 12 (twelve) hours.   lisdexamfetamine  (VYVANSE ) 50 MG capsule Take 1 capsule (50 mg total) by mouth daily.   lisdexamfetamine (VYVANSE ) 50 MG capsule Take 1 capsule (50 mg total) by mouth daily.   lisdexamfetamine (VYVANSE ) 50 MG capsule Take 1 capsule (50 mg total) by mouth daily.   norgestimate -ethinyl estradiol  (MONO-LINYAH) 0.25-35 MG-MCG tablet Take 1 tablet by mouth daily.   No facility-administered encounter medications on file as of 09/27/2024.    Past Surgical History:  Procedure Laterality Date   TONSILLECTOMY AND ADENOIDECTOMY Bilateral 06/11/2019   Procedure: TONSILLECTOMY AND ADENOIDECTOMY;  Surgeon: Karis Clunes, MD;  Location: [facility];  Service: ENT;  Laterality: Bilateral;    Family History  Problem Relation Age of Onset   Hypertension Mother    Liver disease Mother    Other Sister        Cyclic Vomiting Syndrome      Controlled substance contract: n/a    Review of Systems  Constitutional:  Negative for diaphoresis.  Eyes:  Negative for pain.  Respiratory:  Negative for shortness of breath.   Cardiovascular:  Negative for chest pain, palpitations and leg swelling.  Gastrointestinal:  Negative for abdominal pain.  Endocrine: Negative for polydipsia.  Skin:  Negative for rash.  Neurological:  Negative for dizziness, weakness and headaches.  Hematological:  Does not bruise/bleed easily.  All other systems reviewed and are negative.      Objective:   Physical Exam Vitals and nursing note reviewed.  Constitutional:      General: She is not in acute distress.    Appearance: Normal appearance. She is well-developed.  Neck:     Vascular: No carotid bruit or JVD.  Cardiovascular:     Rate and Rhythm: Normal rate and regular rhythm.     Heart sounds: Normal heart sounds.  Pulmonary:     Effort: Pulmonary effort is normal. No respiratory distress.     Breath sounds: Normal breath sounds. No wheezing or rales.  Chest:     Chest wall: No tenderness.  Abdominal:     General: Bowel  sounds are normal. There is no distension or abdominal bruit.     Palpations: Abdomen is soft. There is no hepatomegaly, splenomegaly, mass or pulsatile mass.     Tenderness: There is no abdominal tenderness.  Musculoskeletal:        General: Normal range of motion.     Cervical back: Normal range of motion and neck supple.  Lymphadenopathy:     Cervical: No cervical adenopathy.  Skin:    General: Skin is warm and dry.     Comments: Erythematous maculopapular rash in linear pattern on bil forearms  Neurological:     Mental Status: She is alert and oriented to person, place, and time.     Deep Tendon Reflexes: Reflexes are normal and symmetric.  Psychiatric:        Behavior: Behavior normal.        Thought Content: Thought content normal.        Judgment: Judgment normal.    BP 127/87   Pulse (!) 114   Temp 97.7 F (36.5 C) (Temporal)   Ht 5' 3 (1.6 m)   Wt 155 lb (70.3 kg)   SpO2 95%   BMI 27.46 kg/m       Assessment & Plan:    Theia Dezeeuw comes in today with chief complaint of ADHD (Still has poison ivy from visit last week. They only gave shot. )   Diagnosis and orders addressed:  1. Attention deficit hyperactivity disorder (ADHD), predominantly inattentive type Stress management - ToxASSURE Select 13 (MW), Urine - lisdexamfetamine (VYVANSE ) 50 MG capsule; Take 1 capsule (50 mg total) by mouth daily.  Dispense: 30 capsule; Refill: 0 - lisdexamfetamine (VYVANSE ) 50 MG capsule; Take 1 capsule (50 mg total) by mouth daily.  Dispense: 30 capsule; Refill: 0 - lisdexamfetamine (VYVANSE ) 50 MG capsule; Take 1 capsule (50 mg total) by mouth daily.  Dispense: 30 capsule; Refill: 0  2. Gad Stress management Continue lexapro  20mg  daily  3. Contact dermatitis Avoid scratching Benadryl OTC Cool compresses Depomedrol 80mg  I'm now  Follow up plan: 3 months   Mary-Margaret Gladis, FNP

## 2024-09-30 ENCOUNTER — Encounter: Payer: Self-pay | Admitting: Nurse Practitioner

## 2024-09-30 ENCOUNTER — Ambulatory Visit: Admitting: Nurse Practitioner

## 2024-09-30 VITALS — BP 129/88 | HR 94 | Temp 98.2°F | Ht 63.0 in | Wt 156.0 lb

## 2024-09-30 DIAGNOSIS — L247 Irritant contact dermatitis due to plants, except food: Secondary | ICD-10-CM | POA: Diagnosis not present

## 2024-09-30 DIAGNOSIS — L089 Local infection of the skin and subcutaneous tissue, unspecified: Secondary | ICD-10-CM | POA: Insufficient documentation

## 2024-09-30 MED ORDER — CETIRIZINE HCL 10 MG PO TABS: 10.0000 mg | ORAL_TABLET | Freq: Every day | ORAL | 11 refills | Status: AC

## 2024-09-30 MED ORDER — PREDNISONE 10 MG PO TABS: 10.0000 mg | ORAL_TABLET | Freq: Every day | ORAL | 0 refills | Status: DC

## 2024-09-30 MED ORDER — CEPHALEXIN 500 MG PO CAPS: 500.0000 mg | ORAL_CAPSULE | Freq: Two times a day (BID) | ORAL | 0 refills | Status: DC

## 2024-09-30 NOTE — Progress Notes (Signed)
 Subjective:  Patient ID: Jasmine Velazquez, female    DOB: Jun 14, 2001, 23 y.o.   MRN: [id number]  Patient Care Team: Gladis Mustard, FNP as PCP - General (Family Medicine)   Chief Complaint:  Poison Ivy (Poison ivy for 2 weeks not getting better )   HPI: Jasmine Velazquez is a 23 y.o. female presenting on 09/30/2024 for Poison Ivy (Poison ivy for 2 weeks not getting better )   Discussed the use of AI scribe software for clinical note transcription with the patient, who gave verbal consent to proceed.  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  She has been experiencing worsening symptoms of poison ivy dermatitis since Monday. Initially, the rash appeared as a small dot and has since spread to her chest and worsened on her arm. The rash is very itchy and sensitive to touch.  She has a history of multiple episodes of poison ivy dermatitis since the end of May, requiring both prednisone  injections and oral prednisone  courses. Despite receiving an IM injection of prednisone  on Monday, September 27, 2024, and using Benadryl, cold compresses, and avoiding scratching, her symptoms have not improved.  She has a known allergy to Bactrim , which was prescribed in the past for a similar condition. She has been on both prednisone  injections and pills multiple times since May, with the most recent steroid shot administered last week.  Current medications include Vyvanse , an inhaler, Lexapro , and Topicort  cream, which was prescribed during her last visit.  No other symptoms were discussed.      Relevant past medical, surgical, family, and social history reviewed and updated as indicated.  Allergies and medications reviewed and updated. Data reviewed: Chart in Epic.   Past Medical History:  Diagnosis Date   Tonsil stone     Past Surgical History:  Procedure Laterality Date   TONSILLECTOMY  AND ADENOIDECTOMY Bilateral 06/11/2019   Procedure: TONSILLECTOMY AND ADENOIDECTOMY;  Surgeon: Karis Clunes, MD;  Location: [facility];  Service: ENT;  Laterality: Bilateral;    Social History   Socioeconomic History   Marital status: Single    Spouse name: Not on file   Number of children: Not on file   Years of education: Not on file   Highest education level: Not on file  Occupational History   Not on file  Tobacco Use   Smoking status: Never   Smokeless tobacco: Never  Vaping Use   Vaping status: Never Used  Substance and Sexual Activity   Alcohol use: Never    Alcohol/week: 0.0 standard drinks of alcohol   Drug use: Never   Sexual activity: Never  Other Topics Concern   Not on file  Social History Narrative   Not on file   Social Drivers of Health   Financial Resource Strain: Not on file  Food Insecurity: Not on file  Transportation Needs: Not on file  Physical Activity: Not on file  Stress: Not on file  Social Connections: Not on file  Intimate Partner Violence: Not on file    Outpatient Encounter Medications as of 09/30/2024  Medication Sig   cephALEXin (KEFLEX) 500 MG capsule Take 1 capsule (500 mg total) by mouth 2 (two) times daily.   cetirizine (ZYRTEC) 10 MG tablet Take 1 tablet (10 mg total) by mouth daily.   desoximetasone  (TOPICORT ) 0.25 % cream Apply 1 Application topically 2 (two) times daily.   escitalopram  (LEXAPRO ) 20  MG tablet Take 1 tablet (20 mg total) by mouth daily.   ipratropium (ATROVENT ) 0.03 % nasal spray Place 2 sprays into both nostrils every 12 (twelve) hours.   [START ON 10/09/2024] lisdexamfetamine (VYVANSE ) 50 MG capsule Take 1 capsule (50 mg total) by mouth daily.   [START ON 11/08/2024] lisdexamfetamine (VYVANSE ) 50 MG capsule Take 1 capsule (50 mg total) by mouth daily.   [START ON 12/08/2024] lisdexamfetamine (VYVANSE ) 50 MG capsule Take 1 capsule (50 mg total) by mouth daily.   norgestimate -ethinyl estradiol   (MONO-LINYAH) 0.25-35 MG-MCG tablet Take 1 tablet by mouth daily.   predniSONE  (DELTASONE ) 10 MG tablet Take 1 tablet (10 mg total) by mouth daily with breakfast.   No facility-administered encounter medications on file as of 09/30/2024.    Allergies  Allergen Reactions   Bactrim  [Sulfamethoxazole -Trimethoprim ] Rash    Pertinent ROS per HPI, otherwise unremarkable      Objective:  BP 129/88   Pulse 94   Temp 98.2 F (36.8 C) (Temporal)   Ht 5' 3 (1.6 m)   Wt 156 lb (70.8 kg)   SpO2 98%   BMI 27.63 kg/m    Wt Readings from Last 3 Encounters:  09/30/24 156 lb (70.8 kg)  09/27/24 155 lb (70.3 kg)  09/21/24 156 lb 12.8 oz (71.1 kg)    Physical Exam Vitals and nursing note reviewed.  Constitutional:      General: She is not in acute distress.    Appearance: Normal appearance.  HENT:     Head: Normocephalic and atraumatic.     Nose: Nose normal.     Mouth/Throat:     Mouth: Mucous membranes are moist.  Eyes:     Extraocular Movements: Extraocular movements intact.     Conjunctiva/sclera: Conjunctivae normal.     Pupils: Pupils are equal, round, and reactive to light.  Cardiovascular:     Heart sounds: Normal heart sounds.  Pulmonary:     Effort: Pulmonary effort is normal.     Breath sounds: Normal breath sounds.  Musculoskeletal:        General: Normal range of motion.     Right lower leg: No edema.     Left lower leg: No edema.  Skin:    General: Skin is warm and dry.     Findings: Rash present.  Neurological:     Mental Status: She is alert and oriented to person, place, and time.  Psychiatric:        Thought Content: Thought content normal.        Judgment: Judgment normal.    Physical Exam      Results for orders placed or performed in visit on 07/07/23  ToxASSURE Select 13 (MW), Urine   Collection Time: 07/07/23  8:51 AM  Result Value Ref Range   Summary Note        Pertinent labs & imaging results that were available during my care of  the patient were reviewed by me and considered in my medical decision making.  Assessment & Plan:  Jasmine Velazquez was seen today for poison ivy.  Diagnoses and all orders for this visit:  Irritant contact dermatitis due to plants, except food -     cetirizine (ZYRTEC) 10 MG tablet; Take 1 tablet (10 mg total) by mouth daily. -     cephALEXin (KEFLEX) 500 MG capsule; Take 1 capsule (500 mg total) by mouth 2 (two) times daily. -     predniSONE  (DELTASONE ) 10 MG tablet; Take 1 tablet (10 mg  total) by mouth daily with breakfast.  Skin infection -     cephALEXin (KEFLEX) 500 MG capsule; Take 1 capsule (500 mg total) by mouth 2 (two) times daily. -     predniSONE  (DELTASONE ) 10 MG tablet; Take 1 tablet (10 mg total) by mouth daily with breakfast.     Assessment and Plan Jasmine Velazquez is a 23 year old Caucasian female seen today for contact dermatitis due to poison ivy exposure, no acute distress Assessment & Plan Poison ivy dermatitis with secondary cellulitis of the right arm Recurrent dermatitis with secondary cellulitis. Previous treatments ineffective. Concern for immune compromise with steroids. Prednisone  prescribed at lower dose. - Prescribe Cephalexin 500 mg twice a day for 10 days. - Prescribe Prednisone  10 mg twice a day for 5 days. - Prescribe Zyrtec for pruritus. - Advise oatmeal baths. - Recommend Dawn detergent for urushiol removal. - Instruct to avoid scratching and stay indoors.       Continue all other maintenance medications.  Follow up plan: Return if symptoms worsen or fail to improve.   Continue healthy lifestyle choices, including diet (rich in fruits, vegetables, and lean proteins, and low in salt and simple carbohydrates) and exercise (at least 30 minutes of moderate physical activity daily).  Educational handout given for    Clinical References  Contact Dermatitis Dermatitis is redness, soreness, and swelling (inflammation) of the skin. Contact dermatitis is a reaction  to certain substances that touch the skin. There are two types of this condition: Irritant contact dermatitis. This is the most common type. It happens when something irritates your skin, such as when your hands get dry from washing them too often with soap. You can get this type of reaction even if you have not been exposed to the irritant before. Allergic contact dermatitis. This type is caused by a substance that you are allergic to, such as poison ivy. It occurs when you have been exposed to the substance (allergen) and form a sensitivity to it. In some cases, the reaction may start soon after your first exposure to the allergen. In other cases, it may not start until you are exposed to the allergen again. It may then occur every time you are exposed to the allergen in the future. What are the causes? Irritant contact dermatitis is often caused by exposure to: Makeup. Soaps, detergents, and bleaches. Acids. Metal salts, such as nickel. Allergic contact dermatitis is often caused by exposure to: Poisonous plants. Chemicals. Jewelry. Latex. Medicines. Preservatives in products, such as clothes. What increases the risk? You are more likely to get this condition if you have: A job that exposes you to irritants or allergens. Certain medical conditions. These include asthma and eczema. What are the signs or symptoms? Symptoms of this condition may occur in any place on your body that has been touched by the irritant. Symptoms include: Dryness, flaking, or cracking. Redness. Itching. Pain or a burning feeling. Blisters. Drainage of small amounts of blood or clear fluid from skin cracks. With allergic contact dermatitis, there may also be swelling in areas such as the eyelids, mouth, or genitals. How is this diagnosed? This condition is diagnosed with a medical history and physical exam. A patch skin test may be done to help figure out the cause. If the condition is related to your job,  you may need to see an expert in health problems in the workplace (occupational medicine specialist). How is this treated? This condition is treated by staying away from the cause of the reaction  and protecting your skin from further contact. Treatment may also include: Steroid creams or ointments. Steroid medicines may need be taken by mouth (orally) in more severe cases. Antibiotics or medicines applied to the skin to kill bacteria (antibacterial ointments). These may be needed if a skin infection is present. Antihistamines. These may be taken orally or put on as a lotion to ease itching. A bandage (dressing). Follow these instructions at home: Skin care Moisturize your skin as needed. Put cool, wet cloths (cool compresses) on the affected areas. Try applying baking soda paste to your skin. Stir water into baking soda until it has the consistency of a paste. Do not scratch your skin. Avoid friction to the affected area. Avoid the use of soaps, perfumes, and dyes. Check the affected areas every day for signs of infection. Check for: More redness, swelling, or pain. More fluid or blood. Warmth. Pus or a bad smell. Medicines Take or apply over-the-counter and prescription medicines only as told by your health care provider. If you were prescribed antibiotics, take or apply them as told by your health care provider. Do not stop using the antibiotic even if you start to feel better. Bathing Try taking a bath with: Epsom salts. Follow the instructions on the packaging. You can get these at your local pharmacy or grocery store. Baking soda. Pour a small amount into the bath as told by your health care provider. Colloidal oatmeal. Follow the instructions on the packaging. You can get this at your local pharmacy or grocery store. Bathe less often. This may mean bathing every other day. Bathe in lukewarm water. Avoid using hot water. Bandage care If you were given a dressing, change it as told  by your health care provider. Wash your hands with soap and water for at least 20 seconds before and after you change your dressing. If soap and water are not available, use hand sanitizer. General instructions Avoid the substance that caused your reaction. If you do not know what caused it, keep a journal to try to track what caused it. Write down: What you eat and drink. What cosmetics you use. What you wear in the affected area. This includes jewelry. Contact a health care provider if: Your condition does not get better with treatment. Your condition gets worse. You have any signs of infection. You have a fever. You have new symptoms. Your bone or joint under the affected area becomes painful after the skin has healed. Get help right away if: You notice red streaks coming from the affected area. The affected area turns darker. You have trouble breathing. This information is not intended to replace advice given to you by your health care provider. Make sure you discuss any questions you have with your health care provider. Document Revised: 06/07/2022 Document Reviewed: 06/07/2022 Elsevier Patient Education  2024 [employer]. Poison Ivy Dermatitis Poison ivy dermatitis is redness and soreness of the skin caused by chemicals in the leaves of the poison ivy plant. You may have very bad itching, swelling, a rash, and blisters. What are the causes? Touching a poison ivy plant. Touching something that has the chemical on it. This may include animals or objects that have come in contact with the plant. What increases the risk? Going outdoors often in wooded or [place] areas. Going outdoors without wearing protective clothing, such as closed shoes, long pants, and a long-sleeved shirt. What are the signs or symptoms?  Skin redness. Very bad itching. A rash that often includes bumps and  blisters. The rash usually appears 48 hours after exposure, if you have had it before. If this is the  first time you have it, the rash may not appear until a week after exposure. Swelling. This may occur if the reaction is very bad. Symptoms usually last for 1-2 weeks. The first time you get this condition, symptoms may last 3-4 weeks. How is this treated? This condition may be treated with: Hydrocortisone cream or calamine lotion to relieve itching. Oatmeal baths to soothe the skin. Medicines, such as over-the-counter antihistamine tablets. Oral steroid medicine for very bad reactions. Follow these instructions at home: Medicines Take or apply over-the-counter and prescription medicines only as told by your doctor. Use hydrocortisone cream or calamine lotion as needed to help with itching. General instructions Do not scratch or rub your skin. Put a cold, wet cloth (cold compress) on the affected areas or take baths in cool water. This will help with itching. Avoid hot baths and showers. Take oatmeal baths as needed. Use colloidal oatmeal. You can get this at a pharmacy or grocery store. Follow the instructions on the package. While you have the rash, wash your clothes right after you wear them. Check the affected area every day for signs of infection. Check for: More redness, swelling, or pain. Fluid or blood. Warmth. Pus or a bad smell. Keep all follow-up visits. Your doctor may want to see how your skin is doing with treatment. How is this prevented?  Know what poison ivy looks like, so you can avoid it. This plant has three leaves with flowering branches on a single stem. The leaves are glossy. The leaves have uneven edges that come to a point. If you touch poison ivy, wash your skin with soap and water right away. Be sure to wash under your fingernails. When hiking or camping, wear long pants, a long-sleeved shirt, long socks, and hiking boots. You can also use a lotion on your skin that helps to prevent contact with poison ivy. If you think that your clothes or outdoor gear  came in contact with poison ivy, rinse them off with a garden hose before you bring them inside your house. When doing yard work or gardening, wear gloves, long sleeves, long pants, and boots. Wash your garden tools and gloves if they come in contact with poison ivy. If you think that your pet has come into contact with poison ivy, wash them with pet shampoo and water. Make sure to wear gloves while washing your pet. Contact a doctor if: You have open sores in the rash area. You have any signs of infection. You have redness that spreads past the rash area. You have a fever. You have a rash over a large area of your body. You have a rash on your eyes, mouth, or genitals. Your rash does not get better after a few weeks. Get help right away if: Your face swells or your eyes swell shut. You have trouble breathing. You have trouble swallowing. These symptoms may be an emergency. Do not wait to see if the symptoms will go away. Get help right away. Call 911. This information is not intended to replace advice given to you by your health care provider. Make sure you discuss any questions you have with your health care provider. Document Revised: 05/02/2022 Document Reviewed: 05/02/2022 Elsevier Patient Education  2024 [employer] Inc. Wound Infection A wound infection happens when germs start to grow in a wound. An infection can cause the wound to break open  or get worse. Wound infections need treatment. If a wound infection is not treated, problems can happen. Problems could include getting an infection in your blood or bones. What are the causes? A wound infection is most often caused by a germ called bacteria. The bacteria grow in the wound. Other germs, such as yeast and funguses, can also cause wound infections. What increases the risk? Having a weak body defense system (immune system). Having diabetes. Taking steroid medicines for a long time. Smoking. Being an older adult. Being very  overweight. Taking certain medicines for cancer treatment (chemotherapy). Having bad nutrition. What are the signs or symptoms? Having more redness, swelling, or pain at the wound site. Having more blood or fluid at the wound site. A bad smell coming from a wound or bandage (dressing). Having a fever. Feeling very tired (fatigue). Having warmth at or around the wound. Having pus at the wound site. How is this treated? A wound infection is most often treated with antibiotics and medicines that lower inflammation. The infection should get better in 24-48 hours after you start antibiotics. After 24-48 hours, redness around the wound should stop spreading. The wound should also be less painful. If the condition is very bad, you may need to stay at the hospital or get antibiotics through an IV tube. Follow these instructions at home: Medicines Take or apply over-the-counter and prescription medicines only as told by your doctor. If you were prescribed antibiotics, take or apply them as told by your doctor. Do not stop using them even if you start to feel better. Wound care  Clean the wound each day or as told by your doctor. Wash the wound with mild soap and water. Rinse the wound with water to remove all soap. Pat the wound dry with a clean towel. Do not rub it. Follow instructions from your doctor about how to take care of your wound. Make sure you: Wash your hands with soap and water for at least 20 seconds before and after you change your dressing. If you cannot use soap and water, use hand sanitizer. Change your dressing as told by your doctor. Leave stitches (sutures) or skin glue in place for at least 2 weeks. Leave tape strips alone unless you are told to take them off. You may trim the edges of the tape strips if they curl up. Check your wound every day for signs of infection. Check for: More redness, swelling, or pain. More fluid or blood. Warmth. Pus or a bad smell. General  instructions Drink enough fluid to keep your pee (urine) pale yellow. Do not take baths, swim, or use a hot tub. Ask your doctor about taking showers or sponge baths. Raise (elevate) the wound area above the level of your heart while you are sitting or lying down. Do not scratch or pick at the wound. Keep all follow-up visits. These visits help your doctor make sure a more serious infection is not developing. Contact a doctor if: Your infection does not get better in 24-48 hours. You have signs of infection. You have a fever. Your wound gets larger, turns dark in color, or is more painful. You feel generally sick (malaise) with muscle aches and weakness. Your symptoms get worse. You have vomiting or watery poop (diarrhea) that does not stop. Get help right away if: Your wound that was closed breaks open. You see red streaks coming from the infected area. This information is not intended to replace advice given to you by your health  care provider. Make sure you discuss any questions you have with your health care provider. Document Revised: 09/05/2022 Document Reviewed: 09/05/2022 Elsevier Patient Education  2024 [employer] Inc.  The above assessment and management plan was discussed with the patient. The patient verbalized understanding of and has agreed to the management plan. Patient is aware to call the clinic if they develop any new symptoms or if symptoms persist or worsen. Patient is aware when to return to the clinic for a follow-up visit. Patient educated on when it is appropriate to go to the emergency department.   Katasha Riga [place] Thompson, DNP [facility] [address] [place], [place] [address] [phone]

## 2024-10-01 ENCOUNTER — Ambulatory Visit: Admitting: Nurse Practitioner

## 2024-10-12 ENCOUNTER — Other Ambulatory Visit: Payer: Self-pay | Admitting: Nurse Practitioner

## 2024-10-12 DIAGNOSIS — Z3041 Encounter for surveillance of contraceptive pills: Secondary | ICD-10-CM

## 2024-11-10 ENCOUNTER — Other Ambulatory Visit: Payer: Self-pay | Admitting: Nurse Practitioner

## 2024-11-10 DIAGNOSIS — Z3041 Encounter for surveillance of contraceptive pills: Secondary | ICD-10-CM

## 2024-11-15 NOTE — Telephone Encounter (Signed)
 MMM pt NTBS for PE30-d given 10/12/24

## 2024-11-15 NOTE — Telephone Encounter (Signed)
 Appt 01/09/2024

## 2024-11-18 ENCOUNTER — Ambulatory Visit: Admitting: Nurse Practitioner

## 2024-11-18 ENCOUNTER — Encounter: Payer: Self-pay | Admitting: Nurse Practitioner

## 2024-11-18 VITALS — BP 138/97 | HR 89 | Temp 97.7°F | Ht 63.0 in | Wt 164.0 lb

## 2024-11-18 DIAGNOSIS — F900 Attention-deficit hyperactivity disorder, predominantly inattentive type: Secondary | ICD-10-CM | POA: Diagnosis not present

## 2024-11-18 DIAGNOSIS — Z3041 Encounter for surveillance of contraceptive pills: Secondary | ICD-10-CM | POA: Diagnosis not present

## 2024-11-18 DIAGNOSIS — F419 Anxiety disorder, unspecified: Secondary | ICD-10-CM | POA: Diagnosis not present

## 2024-11-18 MED ORDER — NORGESTIMATE-ETH ESTRADIOL 0.25-35 MG-MCG PO TABS: 1.0000 | ORAL_TABLET | Freq: Every day | ORAL | 11 refills | Status: AC

## 2024-11-18 MED ORDER — LISDEXAMFETAMINE DIMESYLATE 50 MG PO CAPS: 50.0000 mg | ORAL_CAPSULE | Freq: Every day | ORAL | 0 refills | Status: AC

## 2024-11-18 NOTE — Patient Instructions (Signed)

## 2024-11-18 NOTE — Progress Notes (Signed)
 Subjective:    Patient ID: Jasmine Velazquez, female    DOB: 2001-10-07, 23 y.o.   MRN: [id number]    Chief Complaint: medical management of chronic issues     HPI:  Jasmine Velazquez is a 23 y.o. who identifies as a female who was assigned female at birth.   Social history: Lives with: dad Work history: [employer]   Comes in today for follow up of the following chronic medical issues:  1. Attention deficit hyperactivity disorder (ADHD), predominantly inattentive type Patient is on vyvanse  50mg  daily. She has been on ADHD meds since middle school. Is not able to concentrate without meds. No medication side effects.  2  Axiety Is on lexapro  20mg  daily.    11/18/2024   11:28 AM 09/27/2024   11:43 AM 09/21/2024   11:51 AM 07/02/2024   10:36 AM  GAD 7 : Generalized Anxiety Score  Nervous, Anxious, on Edge 0 0 0 0  Control/stop worrying 0 0 0 0  Worry too much - different things 0 0 0 0  Trouble relaxing 0 0 0 0  Restless 0 0 0 0  Easily annoyed or irritable 0 0 0 0  Afraid - awful might happen 0 0 0 0  Total GAD 7 Score 0 0 0 0  Anxiety Difficulty Not difficult at all Not difficult at all  Not difficult at all      11/18/2024   11:28 AM 09/27/2024   11:43 AM 09/21/2024   11:51 AM 07/02/2024   10:36 AM  GAD 7 : Generalized Anxiety Score  Nervous, Anxious, on Edge 0 0 0 0  Control/stop worrying 0 0 0 0  Worry too much - different things 0 0 0 0  Trouble relaxing 0 0 0 0  Restless 0 0 0 0  Easily annoyed or irritable 0 0 0 0  Afraid - awful might happen 0 0 0 0  Total GAD 7 Score 0 0 0 0  Anxiety Difficulty Not difficult at all Not difficult at all  Not difficult at all            New complaints: None today  Allergies  Allergen Reactions   Bactrim  [Sulfamethoxazole -Trimethoprim ] Rash   Outpatient Encounter Medications as of 11/18/2024  Medication Sig   cephALEXin  (KEFLEX ) 500 MG capsule Take 1 capsule (500 mg total) by mouth 2 (two) times daily.   cetirizine   (ZYRTEC ) 10 MG tablet Take 1 tablet (10 mg total) by mouth daily.   desoximetasone  (TOPICORT ) 0.25 % cream Apply 1 Application topically 2 (two) times daily.   escitalopram  (LEXAPRO ) 20 MG tablet Take 1 tablet (20 mg total) by mouth daily.   ipratropium (ATROVENT ) 0.03 % nasal spray Place 2 sprays into both nostrils every 12 (twelve) hours.   lisdexamfetamine (VYVANSE ) 50 MG capsule Take 1 capsule (50 mg total) by mouth daily.   lisdexamfetamine (VYVANSE ) 50 MG capsule Take 1 capsule (50 mg total) by mouth daily.   [START ON 12/08/2024] lisdexamfetamine (VYVANSE ) 50 MG capsule Take 1 capsule (50 mg total) by mouth daily.   norgestimate -ethinyl estradiol  (ORTHO-CYCLEN) 0.25-35 MG-MCG tablet Take 1 tablet by mouth daily. **NEEDS TO BE SEEN BEFORE NEXT REFILL FOR PE**   predniSONE  (DELTASONE ) 10 MG tablet Take 1 tablet (10 mg total) by mouth daily with breakfast.   No facility-administered encounter medications on file as of 11/18/2024.    Past Surgical History:  Procedure Laterality Date   TONSILLECTOMY AND ADENOIDECTOMY Bilateral 06/11/2019   Procedure: TONSILLECTOMY AND ADENOIDECTOMY;  Surgeon: Karis Clunes, MD;  Location: [facility];  Service: ENT;  Laterality: Bilateral;    Family History  Problem Relation Age of Onset   Hypertension Mother    Liver disease Mother    Other Sister        Cyclic Vomiting Syndrome      Controlled substance contract: n/a    Review of Systems  Constitutional:  Negative for diaphoresis.  Eyes:  Negative for pain.  Respiratory:  Negative for shortness of breath.   Cardiovascular:  Negative for chest pain, palpitations and leg swelling.  Gastrointestinal:  Negative for abdominal pain.  Endocrine: Negative for polydipsia.  Skin:  Negative for rash.  Neurological:  Negative for dizziness, weakness and headaches.  Hematological:  Does not bruise/bleed easily.  All other systems reviewed and are negative.      Objective:   Physical  Exam Vitals and nursing note reviewed.  Constitutional:      General: She is not in acute distress.    Appearance: Normal appearance. She is well-developed.  Neck:     Vascular: No carotid bruit or JVD.  Cardiovascular:     Rate and Rhythm: Normal rate and regular rhythm.     Heart sounds: Normal heart sounds.  Pulmonary:     Effort: Pulmonary effort is normal. No respiratory distress.     Breath sounds: Normal breath sounds. No wheezing or rales.  Chest:     Chest wall: No tenderness.  Abdominal:     General: Bowel sounds are normal. There is no distension or abdominal bruit.     Palpations: Abdomen is soft. There is no hepatomegaly, splenomegaly, mass or pulsatile mass.     Tenderness: There is no abdominal tenderness.  Musculoskeletal:        General: Normal range of motion.     Cervical back: Normal range of motion and neck supple.  Lymphadenopathy:     Cervical: No cervical adenopathy.  Skin:    General: Skin is warm and dry.  Neurological:     Mental Status: She is alert and oriented to person, place, and time.     Deep Tendon Reflexes: Reflexes are normal and symmetric.  Psychiatric:        Behavior: Behavior normal.        Thought Content: Thought content normal.        Judgment: Judgment normal.    BP (!) 138/97   Pulse 89   Temp 97.7 F (36.5 C) (Temporal)   Ht 5' 3 (1.6 m)   Wt 164 lb (74.4 kg)   SpO2 97%   BMI 29.05 kg/m        Assessment & Plan:    Jasmine Velazquez comes in today with chief complaint of ADHD  Diagnosis and orders addressed:  1. Attention deficit hyperactivity disorder (ADHD), predominantly inattentive type Stress management - ToxASSURE Select 13 (MW), Urine - lisdexamfetamine (VYVANSE ) 50 MG capsule; Take 1 capsule (50 mg total) by mouth daily.  Dispense: 30 capsule; Refill: 0 - lisdexamfetamine (VYVANSE ) 50 MG capsule; Take 1 capsule (50 mg total) by mouth daily.  Dispense: 30 capsule; Refill: 0 - lisdexamfetamine (VYVANSE ) 50  MG capsule; Take 1 capsule (50 mg total) by mouth daily.  Dispense: 30 capsule; Refill: 0  2. Gad Stress management Concerned about weight gain from lexapro - is going to do 1/2 tablet a day.  Follow up plan: 3 months   Mary-Margaret Gladis, FNP

## 2025-04-01 ENCOUNTER — Ambulatory Visit: Admitting: Nurse Practitioner

## 2025-08-30 ENCOUNTER — Ambulatory Visit: Admitting: Dermatology

## 2025-08-31 ENCOUNTER — Ambulatory Visit: Admitting: Dermatology
